# Patient Record
Sex: Female | Born: 1935 | Race: Black or African American | Hispanic: No | Marital: Single | Smoking: Former smoker
Health system: Southern US, Community
[De-identification: ages and names within clinical notes are randomized; demographics above are authoritative.]

## PROBLEM LIST (undated history)

## (undated) DIAGNOSIS — F0390 Unspecified dementia without behavioral disturbance: Secondary | ICD-10-CM

## (undated) DIAGNOSIS — M1990 Unspecified osteoarthritis, unspecified site: Secondary | ICD-10-CM

## (undated) DIAGNOSIS — R600 Localized edema: Secondary | ICD-10-CM

## (undated) DIAGNOSIS — G3184 Mild cognitive impairment, so stated: Secondary | ICD-10-CM

## (undated) DIAGNOSIS — G5603 Carpal tunnel syndrome, bilateral upper limbs: Secondary | ICD-10-CM

## (undated) DIAGNOSIS — I10 Essential (primary) hypertension: Secondary | ICD-10-CM

## (undated) DIAGNOSIS — L03012 Cellulitis of left finger: Secondary | ICD-10-CM

## (undated) DIAGNOSIS — E0590 Thyrotoxicosis, unspecified without thyrotoxic crisis or storm: Secondary | ICD-10-CM

## (undated) DIAGNOSIS — K219 Gastro-esophageal reflux disease without esophagitis: Secondary | ICD-10-CM

## (undated) DIAGNOSIS — E538 Deficiency of other specified B group vitamins: Secondary | ICD-10-CM

## (undated) DIAGNOSIS — E785 Hyperlipidemia, unspecified: Secondary | ICD-10-CM

## (undated) DIAGNOSIS — A048 Other specified bacterial intestinal infections: Secondary | ICD-10-CM

## (undated) DIAGNOSIS — R609 Edema, unspecified: Secondary | ICD-10-CM

## (undated) DIAGNOSIS — I351 Nonrheumatic aortic (valve) insufficiency: Secondary | ICD-10-CM

## (undated) HISTORY — DX: Cellulitis of left finger: L03.012

## (undated) HISTORY — PX: TOTAL KNEE ARTHROPLASTY: SHX125

## (undated) HISTORY — DX: Unspecified osteoarthritis, unspecified site: M19.90

## (undated) HISTORY — DX: Mild cognitive impairment of uncertain or unknown etiology: G31.84

## (undated) HISTORY — PX: CARPAL TUNNEL RELEASE: SHX101

## (undated) HISTORY — DX: Thyrotoxicosis, unspecified without thyrotoxic crisis or storm: E05.90

## (undated) HISTORY — DX: Deficiency of other specified B group vitamins: E53.8

## (undated) HISTORY — DX: Carpal tunnel syndrome, bilateral upper limbs: G56.03

## (undated) HISTORY — DX: Other specified bacterial intestinal infections: A04.8

---

## 2003-10-23 ENCOUNTER — Inpatient Hospital Stay: Payer: Self-pay | Admitting: Unknown Physician Specialty

## 2005-04-03 ENCOUNTER — Ambulatory Visit: Payer: Self-pay | Admitting: Internal Medicine

## 2006-08-17 ENCOUNTER — Ambulatory Visit: Payer: Self-pay | Admitting: Internal Medicine

## 2007-09-01 ENCOUNTER — Ambulatory Visit: Payer: Self-pay | Admitting: Internal Medicine

## 2009-01-24 ENCOUNTER — Ambulatory Visit: Payer: Self-pay | Admitting: Internal Medicine

## 2010-03-11 ENCOUNTER — Ambulatory Visit: Payer: Self-pay | Admitting: Internal Medicine

## 2011-05-12 ENCOUNTER — Ambulatory Visit: Payer: Self-pay | Admitting: Internal Medicine

## 2012-05-12 ENCOUNTER — Ambulatory Visit: Payer: Self-pay | Admitting: Internal Medicine

## 2012-11-07 ENCOUNTER — Emergency Department: Payer: Self-pay | Admitting: Emergency Medicine

## 2012-11-07 LAB — COMPREHENSIVE METABOLIC PANEL
Albumin: 3.6 g/dL (ref 3.4–5.0)
Alkaline Phosphatase: 133 U/L (ref 50–136)
BUN: 12 mg/dL (ref 7–18)
Chloride: 100 mmol/L (ref 98–107)
Creatinine: 1.14 mg/dL (ref 0.60–1.30)
EGFR (African American): 53 — ABNORMAL LOW
Glucose: 130 mg/dL — ABNORMAL HIGH (ref 65–99)
Osmolality: 279 (ref 275–301)
Potassium: 3.2 mmol/L — ABNORMAL LOW (ref 3.5–5.1)
SGOT(AST): 23 U/L (ref 15–37)
SGPT (ALT): 14 U/L (ref 12–78)
Sodium: 139 mmol/L (ref 136–145)

## 2012-11-07 LAB — URINALYSIS, COMPLETE
Bacteria: NONE SEEN
Bilirubin,UR: NEGATIVE
Blood: NEGATIVE
Glucose,UR: NEGATIVE mg/dL (ref 0–75)
Hyaline Cast: 9
Nitrite: NEGATIVE
Protein: NEGATIVE
Specific Gravity: 1.012 (ref 1.003–1.030)

## 2012-11-07 LAB — TROPONIN I: Troponin-I: <0.02 ng/mL

## 2012-11-07 LAB — PRO B NATRIURETIC PEPTIDE: B-Type Natriuretic Peptide: 686 pg/mL — ABNORMAL HIGH (ref 0–450)

## 2012-11-07 LAB — CBC
MCH: 30.8 pg (ref 26.0–34.0)
MCHC: 34.6 g/dL (ref 32.0–36.0)
RBC: 3.7 10*6/uL — ABNORMAL LOW (ref 3.80–5.20)

## 2013-11-23 ENCOUNTER — Ambulatory Visit: Payer: Self-pay | Admitting: Unknown Physician Specialty

## 2015-03-23 ENCOUNTER — Encounter: Payer: Self-pay | Admitting: *Deleted

## 2015-03-23 ENCOUNTER — Ambulatory Visit (INDEPENDENT_AMBULATORY_CARE_PROVIDER_SITE_OTHER)
Admission: EM | Admit: 2015-03-23 | Discharge: 2015-03-23 | Disposition: A | Discharge status: Other Acute Inpatient Hospital | Payer: Medicare Other | Source: Home / Self Care | Attending: Family Medicine | Admitting: Family Medicine

## 2015-03-23 ENCOUNTER — Ambulatory Visit (INDEPENDENT_AMBULATORY_CARE_PROVIDER_SITE_OTHER): Payer: Medicare Other

## 2015-03-23 ENCOUNTER — Encounter: Payer: Self-pay | Admitting: Emergency Medicine

## 2015-03-23 ENCOUNTER — Observation Stay
Admission: EM | Admit: 2015-03-23 | Discharge: 2015-03-26 | Disposition: A | Discharge status: Home / Self Care | Payer: Medicare Other | Attending: Internal Medicine | Admitting: Internal Medicine

## 2015-03-23 DIAGNOSIS — I351 Nonrheumatic aortic (valve) insufficiency: Secondary | ICD-10-CM | POA: Diagnosis not present

## 2015-03-23 DIAGNOSIS — E118 Type 2 diabetes mellitus with unspecified complications: Secondary | ICD-10-CM | POA: Diagnosis not present

## 2015-03-23 DIAGNOSIS — K219 Gastro-esophageal reflux disease without esophagitis: Secondary | ICD-10-CM | POA: Diagnosis present

## 2015-03-23 DIAGNOSIS — A4901 Methicillin susceptible Staphylococcus aureus infection, unspecified site: Secondary | ICD-10-CM | POA: Diagnosis not present

## 2015-03-23 DIAGNOSIS — L03114 Cellulitis of left upper limb: Secondary | ICD-10-CM | POA: Diagnosis not present

## 2015-03-23 DIAGNOSIS — L03012 Cellulitis of left finger: Principal | ICD-10-CM | POA: Insufficient documentation

## 2015-03-23 DIAGNOSIS — Z791 Long term (current) use of non-steroidal anti-inflammatories (NSAID): Secondary | ICD-10-CM | POA: Insufficient documentation

## 2015-03-23 DIAGNOSIS — IMO0001 Reserved for inherently not codable concepts without codable children: Secondary | ICD-10-CM

## 2015-03-23 DIAGNOSIS — Z823 Family history of stroke: Secondary | ICD-10-CM | POA: Insufficient documentation

## 2015-03-23 DIAGNOSIS — Z880 Allergy status to penicillin: Secondary | ICD-10-CM | POA: Insufficient documentation

## 2015-03-23 DIAGNOSIS — Z818 Family history of other mental and behavioral disorders: Secondary | ICD-10-CM | POA: Diagnosis not present

## 2015-03-23 DIAGNOSIS — E876 Hypokalemia: Secondary | ICD-10-CM | POA: Diagnosis not present

## 2015-03-23 DIAGNOSIS — I10 Essential (primary) hypertension: Secondary | ICD-10-CM | POA: Diagnosis present

## 2015-03-23 DIAGNOSIS — Z882 Allergy status to sulfonamides status: Secondary | ICD-10-CM | POA: Diagnosis not present

## 2015-03-23 DIAGNOSIS — Z87891 Personal history of nicotine dependence: Secondary | ICD-10-CM | POA: Insufficient documentation

## 2015-03-23 DIAGNOSIS — Z833 Family history of diabetes mellitus: Secondary | ICD-10-CM | POA: Diagnosis not present

## 2015-03-23 DIAGNOSIS — E785 Hyperlipidemia, unspecified: Secondary | ICD-10-CM | POA: Diagnosis not present

## 2015-03-23 DIAGNOSIS — Z886 Allergy status to analgesic agent status: Secondary | ICD-10-CM | POA: Insufficient documentation

## 2015-03-23 DIAGNOSIS — Z888 Allergy status to other drugs, medicaments and biological substances status: Secondary | ICD-10-CM | POA: Diagnosis not present

## 2015-03-23 DIAGNOSIS — L03019 Cellulitis of unspecified finger: Secondary | ICD-10-CM | POA: Diagnosis present

## 2015-03-23 DIAGNOSIS — E119 Type 2 diabetes mellitus without complications: Secondary | ICD-10-CM

## 2015-03-23 DIAGNOSIS — Z9889 Other specified postprocedural states: Secondary | ICD-10-CM | POA: Diagnosis not present

## 2015-03-23 DIAGNOSIS — L02512 Cutaneous abscess of left hand: Secondary | ICD-10-CM | POA: Diagnosis present

## 2015-03-23 HISTORY — DX: Hyperlipidemia, unspecified: E78.5

## 2015-03-23 HISTORY — DX: Edema, unspecified: R60.9

## 2015-03-23 HISTORY — DX: Localized edema: R60.0

## 2015-03-23 HISTORY — DX: Essential (primary) hypertension: I10

## 2015-03-23 HISTORY — DX: Gastro-esophageal reflux disease without esophagitis: K21.9

## 2015-03-23 HISTORY — DX: Nonrheumatic aortic (valve) insufficiency: I35.1

## 2015-03-23 LAB — CBC WITH DIFFERENTIAL/PLATELET
BASOS ABS: 0.1 10*3/uL (ref 0–0.1)
BASOS PCT: 1 %
Eosinophils Absolute: 0 10*3/uL (ref 0–0.7)
Eosinophils Relative: 0 %
HEMATOCRIT: 30.7 % — AB (ref 35.0–47.0)
HEMOGLOBIN: 10.3 g/dL — AB (ref 12.0–16.0)
Lymphocytes Relative: 6 %
Lymphs Abs: 0.7 10*3/uL — ABNORMAL LOW (ref 1.0–3.6)
MCH: 29 pg (ref 26.0–34.0)
MCHC: 33.6 g/dL (ref 32.0–36.0)
MCV: 86.4 fL (ref 80.0–100.0)
Monocytes Absolute: 0.8 10*3/uL (ref 0.2–0.9)
Monocytes Relative: 7 %
NEUTROS ABS: 9.3 10*3/uL — AB (ref 1.4–6.5)
NEUTROS PCT: 86 %
Platelets: 250 10*3/uL (ref 150–440)
RBC: 3.55 MIL/uL — AB (ref 3.80–5.20)
RDW: 14 % (ref 11.5–14.5)
WBC: 10.8 10*3/uL (ref 3.6–11.0)

## 2015-03-23 LAB — BASIC METABOLIC PANEL
ANION GAP: 9 (ref 5–15)
BUN: 13 mg/dL (ref 6–20)
CHLORIDE: 97 mmol/L — AB (ref 101–111)
CO2: 28 mmol/L (ref 22–32)
Calcium: 8.6 mg/dL — ABNORMAL LOW (ref 8.9–10.3)
Creatinine, Ser: 0.98 mg/dL (ref 0.44–1.00)
GFR calc non Af Amer: 53 mL/min — ABNORMAL LOW (ref >60)
Glucose, Bld: 183 mg/dL — ABNORMAL HIGH (ref 65–99)
Potassium: 3.1 mmol/L — ABNORMAL LOW (ref 3.5–5.1)
Sodium: 134 mmol/L — ABNORMAL LOW (ref 135–145)

## 2015-03-23 NOTE — ED Notes (Signed)
Pt presents to ED with swollen left index finger. Area is swollen and red up to and including knuckles of left hand. Pt is a/o with NAD at this time in triage.

## 2015-03-23 NOTE — ED Notes (Signed)
Left index finger edematous, and discolored. Denies injury. Stated onset 1 week ago but much worse today.

## 2015-03-23 NOTE — ED Provider Notes (Signed)
[facility]  ____________________________________________  Time seen: Approximately 1800 PM  I have reviewed the triage vital signs and the nursing notes.   HISTORY  Chief Complaint Hand Pain   HPI Christine [name] is a 82 y.o. female presents with family at bedside for the complaints of left index finger that is red and swollen and painful. Patient reports that the area has gradually progressed and over the last 1 week. States the last 2 days it has gotten much larger and redder. Patient states that she is even noticed the last 2 days that her hand is beginning to become red and swollen. Denies fall or injury. Denies known break in skin. Denies drainage. Denies fevers. Denies other complaints. Denies loss of sensation.  Reports has remained active. Reports continues to eat and drink well. Reports she is right-hand dominant. Denies history of diabetes.  PCP: Gilford Rile   Past Medical History  Diagnosis Date  . Hypertension     There are no active problems to display for this patient.   Past Surgical History  Procedure Laterality Date  . Carpal tunnel release Bilateral     No current outpatient prescriptions on file.  Allergies Review of patient's allergies indicates no known allergies.  History reviewed. No pertinent family history.  Social History Social History  Substance Use Topics  . Smoking status: Former Research scientist (life sciences)  . Smokeless tobacco: None  . Alcohol Use: No    Review of Systems Constitutional: No fever/chills Eyes: No visual changes. ENT: No sore throat. Cardiovascular: Denies chest pain. Respiratory: Denies shortness of breath. Gastrointestinal: No abdominal pain.  No nausea, no vomiting.  No diarrhea.  No constipation. Genitourinary: Negative for dysuria. Musculoskeletal: Negative for back pain. Skin: Negative for rash. left second finger redness and swelling.  Neurological: Negative for headaches, focal weakness or numbness.  10-point ROS  otherwise negative.  ____________________________________________   PHYSICAL EXAM:  VITAL SIGNS: ED Triage Vitals  Enc Vitals Group     BP 03/23/15 1725 131/64 mmHg     Pulse Rate 03/23/15 1725 68     Resp 03/23/15 1725 16     Temp 03/23/15 1725 98.3 F (36.8 C)     Temp Source 03/23/15 1725 Oral     SpO2 03/23/15 1725 98 %     Weight 03/23/15 1725 220 lb (99.791 kg)     Height 03/23/15 1725 5\' 6"  (1.676 m)     Head Cir --      Peak Flow --      Pain Score 03/23/15 1908 4     Pain Loc --      Pain Edu? --      Excl. in [place]? --     Constitutional: Alert and oriented. Well appearing and in no acute distress. Eyes: Conjunctivae are normal. PERRL. EOMI. Head: Atraumatic.  Nose: No congestion/rhinnorhea.  Mouth/Throat: Mucous membranes are moist.  Neck: No stridor.  No cervical spine tenderness to palpation. Cardiovascular: Normal rate, regular rhythm. Grossly normal heart sounds.  Good peripheral circulation. Respiratory: Normal respiratory effort.  No retractions. Lungs CTAB. Gastrointestinal: Soft and nontender.  Musculoskeletal: No lower or upper extremity tenderness. bilateral lower extremities mild edema, per patient chronic and unchanged. No cervical, thoracic or lumbar tenderness to palpation.   Neurologic:  Normal speech and language. No gross focal neurologic deficits are appreciated. No gait instability. Skin:  Skin is warm, dry and intact. No rash noted. Except : Left second digit moderate swelling with moderate ecchymosis, distal left second finger  at the base of the fingernail extending to PIP joint area of fluctuance and visible purulent material, no active drainage, left second finger erythema and swelling further extends to left hand along the first and second metacarpal, left second finger moderate tenderness to palpation with decreased flexion and extension, sensation intact to left second finger as well as capillary refill less than 2 seconds. No noted break in  skin.  Psychiatric: Mood and affect are normal. Speech and behavior are normal.  ____________________________________________   LABS (all labs ordered are listed, but only abnormal results are displayed)  Labs Reviewed - No data to display  RADIOLOGY  EXAM: LEFT INDEX FINGER 2+V  COMPARISON: None.  FINDINGS: No fracture or dislocation is seen.  The joint spaces are preserved. No marginal erosions.  Mild diffuse soft tissue swelling, most prominent dorsally.  No radiopaque foreign body is seen.  IMPRESSION: Mild diffuse soft tissue swelling.  No fracture, dislocation, or radiopaque foreign body is seen.   Electronically Signed By: Julian Hy M.D. On: 03/23/2015 18:24  I, Marylene Land, personally viewed and evaluated these images (plain radiographs) as part of my medical decision making, as well as reviewing the written report by the radiologist.  ____________________________________________  INITIAL IMPRESSION / [place] / ED COURSE  Pertinent labs & imaging results that were available during my care of the patient were reviewed by me and considered in my medical decision making (see chart for details).  very well-appearing patient. No acute distress. Presents for the complaints of gradual onset of left second finger and left hand redness and swelling over the last 1 week. Denies fall or injury. Denies known break in skin. Reports the last 2 days has acutely increased. Denies drainage. Patient with large amount of erythema and swelling with visible purulence beneath the skin. X-ray with mild diffuse soft tissue swelling, no fracture dislocation or radiopaque foreign body per radiologist.   Suspect left second finger paronychia which worsened in to progressing cellulitis.   Discussed in detail with patient and family members concerned for need for IV antibiotics. Discussed the concern that patient would likely not improve with just oral  antibiotics at this point. Counseled with Dr. Alveta Heimlich who also saw patient agreed with plan. Recommend for patient to go to emergency room of her choice for further evaluation and likely IV antibiotics. Discussed with patient to go to emergency room that would likely have hand surgeon present and may need OR I&D. However patient and family reports that they will go to [facility] ER. [facility] called and given report. Patient and family state that they will go by private vehicle and do not want to go by EMS. patient stable at the time of transfer.  Discussed follow up with Primary care physician this week. Discussed follow up and return parameters including no resolution or any worsening concerns. Patient verbalized understanding and agreed to plan.   ____________________________________________   FINAL CLINICAL IMPRESSION(S) / ED DIAGNOSES  Final diagnoses:  Cellulitis of second finger of left hand  Cellulitis of left hand      Note: This dictation was prepared with Dragon dictation along with smaller phrase technology. Any transcriptional errors that result from this process are unintentional.    Marylene Land, NP 03/23/15 2002  Marylene Land, NP 03/23/15 2004

## 2015-03-23 NOTE — ED Provider Notes (Signed)
[facility] Emergency Department Provider Note  Time seen: 11:55 PM  I have reviewed the triage vital signs and the nursing notes.   HISTORY  Chief Complaint Hand Problem    HPI Christine [name] is a 82 y.o. female with a past medical history of hypertension who presents the emergency department with a swollen tender left index finger. According to the patient proximal by one week ago she began with mild pain and swelling around the tip of her left index finger, she states of the past 2 days it is really increased and is now extending into the hand. Patient denies fever, does state nausea but denies vomiting.Describes the pain as moderate, much worse with any attempted moving, patient cannot bend the finger.     Past Medical History  Diagnosis Date  . Hypertension     There are no active problems to display for this patient.   Past Surgical History  Procedure Laterality Date  . Carpal tunnel release Bilateral   . Total knee arthroplasty Bilateral     No current outpatient prescriptions on file.  Allergies Review of patient's allergies indicates no known allergies.  No family history on file.  Social History Social History  Substance Use Topics  . Smoking status: Former Research scientist (life sciences)  . Smokeless tobacco: None  . Alcohol Use: No    Review of Systems Constitutional: Negative for fever. Cardiovascular: Negative for chest pain. Respiratory: Negative for shortness of breath. Gastrointestinal: Negative for abdominal pain. Positive nausea. Skin: Redness, swelling of the left index finger Neurological: Negative for headache 10-point ROS otherwise negative.  ____________________________________________   PHYSICAL EXAM:  VITAL SIGNS: ED Triage Vitals  Enc Vitals Group     BP 03/23/15 2035 127/72 mmHg     Pulse Rate 03/23/15 2035 69     Resp 03/23/15 2035 20     Temp 03/23/15 2035 98 F (36.7 C)     Temp Source 03/23/15 2035 Oral   SpO2 03/23/15 2035 98 %     Weight 03/23/15 2035 232 lb (105.235 kg)     Height 03/23/15 2035 5\' 5"  (1.651 m)     Head Cir --      Peak Flow --      Pain Score 03/23/15 2121 0     Pain Loc --      Pain Edu? --      Excl. in [place]? --     Constitutional: Alert and oriented. Well appearing and in no distress. Eyes: Normal exam ENT   Head: Normocephalic and atraumatic.   Mouth/Throat: Mucous membranes are moist. Cardiovascular: Normal rate, regular rhythm. No murmur Respiratory: Normal respiratory effort without tachypnea nor retractions. Breath sounds are clear  Gastrointestinal: Soft and nontender. No distention.  Musculoskeletal: Redness, swelling of the left index finger. Patient is unable to bend the finger. Neurologic:  Normal speech and language. No gross focal neurologic deficits Skin:  Skin is warm, dry and intact. Redness as stated above. Psychiatric: Mood and affect are normal. Speech and behavior are normal.   ____________________________________________     RADIOLOGY  X-ray performed at urgent care shows no abnormality  ____________________________________________   INITIAL IMPRESSION / ASSESSMENT AND PLAN / ED COURSE  Pertinent labs & imaging results that were available during my care of the patient were reviewed by me and considered in my medical decision making (see chart for details).  Labs are largely within normal limits besides a slightly elevated blood glucose of 183, patient denies any history  of diabetes. Hand concerning for significant left index finger infection. We will place an IV, start antibiotics. I will discussed with orthopedics, patient will likely require operation to open the finger.   Orthopedic physician is coming to the hospital to perform a bedside procedure to drain the finger. Request 2 g of Ancef IV, also states the patient will require admission to the medical service for continued antibiotics with orthopedics consultation. We will  discuss this with the hospitalist team.   Orthopedics at bedside performing incision and drainage. Hospitalist to admit. ____________________________________________   FINAL CLINICAL IMPRESSION(S) / ED DIAGNOSES  Deep hand infection, left Cellulitis   Harvest Dark, MD 03/24/15 [phone]

## 2015-03-23 NOTE — Discharge Instructions (Signed)
Go directly to emergency room as discussed. This is very important.   Cellulitis Cellulitis is an infection of the skin and the tissue beneath it. The infected area is usually red and tender. Cellulitis occurs most often in the arms and lower legs.  CAUSES  Cellulitis is caused by bacteria that enter the skin through cracks or cuts in the skin. The most common types of bacteria that cause cellulitis are staphylococci and streptococci. SIGNS AND SYMPTOMS   Redness and warmth.  Swelling.  Tenderness or pain.  Fever. DIAGNOSIS  Your health care provider can usually determine what is wrong based on a physical exam. Blood tests may also be done. TREATMENT  Treatment usually involves taking an antibiotic medicine. HOME CARE INSTRUCTIONS   Take your antibiotic medicine as directed by your health care provider. Finish the antibiotic even if you start to feel better.  Keep the infected arm or leg elevated to reduce swelling.  Apply a warm cloth to the affected area up to 4 times per day to relieve pain.  Take medicines only as directed by your health care provider.  Keep all follow-up visits as directed by your health care provider. SEEK MEDICAL CARE IF:   You notice red streaks coming from the infected area.  Your red area gets larger or turns dark in color.  Your bone or joint underneath the infected area becomes painful after the skin has healed.  Your infection returns in the same area or another area.  You notice a swollen bump in the infected area.  You develop new symptoms.  You have a fever. SEEK IMMEDIATE MEDICAL CARE IF:   You feel very sleepy.  You develop vomiting or diarrhea.  You have a general ill feeling (malaise) with muscle aches and pains.   This information is not intended to replace advice given to you by your health care provider. Make sure you discuss any questions you have with your health care provider.   Document Released: 10/09/2004 Document  Revised: 09/20/2014 Document Reviewed: 03/17/2011 Elsevier Interactive Patient Education 2016 [place] is an infection of the skin. It happens near a fingernail or toenail. It may cause pain and swelling around the nail. Usually, it is not serious and it clears up with treatment. HOME CARE  Soak the fingers or toes in warm water as told by your doctor. You may be told to do this for 20 minutes, 2-3 times a day.  Keep the area dry when you are not soaking it.  Take medicines only as told by your doctor.  If you were given an antibiotic medicine, finish all of it even if you start to feel better.  Keep the affected area clean.  Do not try to drain a fluid-filled bump yourself.  Wear rubber gloves when putting your hands in water.  Wear gloves if your hands might touch cleaners or chemicals.  Follow your doctor's instructions about:  Wound care.  Bandage (dressing) changes and removal. GET HELP IF:  Your symptoms get worse or do not improve.  You have a fever or chills.  You have redness spreading from the affected area.  You have more fluid, blood, or pus coming from the affected area.  Your finger or knuckle is swollen or is hard to move.   This information is not intended to replace advice given to you by your health care provider. Make sure you discuss any questions you have with your health care provider.   Document  Released: 12/18/2008 Document Revised: 05/16/2014 Document Reviewed: 12/07/2013 Elsevier Interactive Patient Education [employer].

## 2015-03-24 ENCOUNTER — Encounter: Payer: Self-pay | Admitting: Internal Medicine

## 2015-03-24 DIAGNOSIS — E119 Type 2 diabetes mellitus without complications: Secondary | ICD-10-CM

## 2015-03-24 DIAGNOSIS — I10 Essential (primary) hypertension: Secondary | ICD-10-CM | POA: Diagnosis present

## 2015-03-24 DIAGNOSIS — L03019 Cellulitis of unspecified finger: Secondary | ICD-10-CM | POA: Diagnosis present

## 2015-03-24 DIAGNOSIS — K219 Gastro-esophageal reflux disease without esophagitis: Secondary | ICD-10-CM | POA: Diagnosis present

## 2015-03-24 DIAGNOSIS — E785 Hyperlipidemia, unspecified: Secondary | ICD-10-CM | POA: Diagnosis present

## 2015-03-24 LAB — GLUCOSE, CAPILLARY
GLUCOSE-CAPILLARY: 106 mg/dL — AB (ref 65–99)
GLUCOSE-CAPILLARY: 100 mg/dL — AB (ref 65–99)
Glucose-Capillary: 140 mg/dL — ABNORMAL HIGH (ref 65–99)

## 2015-03-24 LAB — BASIC METABOLIC PANEL
ANION GAP: 8 (ref 5–15)
BUN: 13 mg/dL (ref 6–20)
CO2: 26 mmol/L (ref 22–32)
Calcium: 8.3 mg/dL — ABNORMAL LOW (ref 8.9–10.3)
Chloride: 102 mmol/L (ref 101–111)
Creatinine, Ser: 0.9 mg/dL (ref 0.44–1.00)
GFR, EST NON AFRICAN AMERICAN: 59 mL/min — AB (ref >60)
GLUCOSE: 100 mg/dL — AB (ref 65–99)
POTASSIUM: 3.1 mmol/L — AB (ref 3.5–5.1)
Sodium: 136 mmol/L (ref 135–145)

## 2015-03-24 LAB — CBC
HEMATOCRIT: 29 % — AB (ref 35.0–47.0)
HEMOGLOBIN: 9.9 g/dL — AB (ref 12.0–16.0)
MCH: 29.3 pg (ref 26.0–34.0)
MCHC: 34.1 g/dL (ref 32.0–36.0)
MCV: 86 fL (ref 80.0–100.0)
Platelets: 231 10*3/uL (ref 150–440)
RBC: 3.37 MIL/uL — ABNORMAL LOW (ref 3.80–5.20)
RDW: 14 % (ref 11.5–14.5)
WBC: 7.7 10*3/uL (ref 3.6–11.0)

## 2015-03-24 LAB — HEMOGLOBIN A1C: Hgb A1c MFr Bld: 5.4 % (ref 4.0–6.0)

## 2015-03-24 MED ORDER — ONDANSETRON HCL 4 MG PO TABS: 4.0000 mg | ORAL_TABLET | Freq: Four times a day (QID) | ORAL | Status: DC | PRN

## 2015-03-24 MED ORDER — POTASSIUM CHLORIDE CRYS ER 20 MEQ PO TBCR
40.0000 meq | EXTENDED_RELEASE_TABLET | Freq: Once | ORAL | Status: AC
  Administered 2015-03-24: 40 meq via ORAL
  Filled 2015-03-24: qty 2

## 2015-03-24 MED ORDER — ONDANSETRON HCL 4 MG/2ML IJ SOLN
4.0000 mg | Freq: Four times a day (QID) | INTRAMUSCULAR | Status: DC | PRN
Start: 2015-03-24 — End: 2015-03-26

## 2015-03-24 MED ORDER — PANTOPRAZOLE SODIUM 40 MG PO TBEC
40.0000 mg | DELAYED_RELEASE_TABLET | Freq: Every day | ORAL | Status: DC
  Administered 2015-03-24 – 2015-03-26 (×3): 40 mg via ORAL
  Filled 2015-03-24 (×3): qty 1

## 2015-03-24 MED ORDER — VANCOMYCIN HCL 10 G IV SOLR
1250.0000 mg | INTRAVENOUS | Status: DC
  Administered 2015-03-24: 1250 mg via INTRAVENOUS
  Filled 2015-03-24 (×2): qty 1250

## 2015-03-24 MED ORDER — VANCOMYCIN HCL 10 G IV SOLR
1250.0000 mg | Freq: Once | INTRAVENOUS | Status: AC
  Administered 2015-03-24: 1250 mg via INTRAVENOUS
  Filled 2015-03-24 (×3): qty 1250

## 2015-03-24 MED ORDER — ACETAMINOPHEN 325 MG PO TABS: 650.0000 mg | ORAL_TABLET | Freq: Four times a day (QID) | ORAL | Status: DC | PRN

## 2015-03-24 MED ORDER — DEXTROSE 5 % IV SOLN
2.0000 g | Freq: Two times a day (BID) | INTRAVENOUS | Status: DC
  Administered 2015-03-24 (×2): 2 g via INTRAVENOUS
  Filled 2015-03-24 (×4): qty 2

## 2015-03-24 MED ORDER — TORSEMIDE 20 MG PO TABS
20.0000 mg | ORAL_TABLET | Freq: Two times a day (BID) | ORAL | Status: DC
  Administered 2015-03-24 – 2015-03-26 (×5): 20 mg via ORAL
  Filled 2015-03-24: qty 2
  Filled 2015-03-24: qty 1
  Filled 2015-03-24: qty 2
  Filled 2015-03-24: qty 1
  Filled 2015-03-24: qty 2
  Filled 2015-03-24 (×2): qty 1

## 2015-03-24 MED ORDER — ACETAMINOPHEN 650 MG RE SUPP: 650.0000 mg | Freq: Four times a day (QID) | RECTAL | Status: DC | PRN

## 2015-03-24 MED ORDER — ENOXAPARIN SODIUM 40 MG/0.4ML ~~LOC~~ SOLN
40.0000 mg | SUBCUTANEOUS | Status: DC
  Administered 2015-03-25: 40 mg via SUBCUTANEOUS
  Filled 2015-03-24 (×2): qty 0.4

## 2015-03-24 MED ORDER — CEFAZOLIN SODIUM-DEXTROSE 2-3 GM-% IV SOLR
2.0000 g | Freq: Once | INTRAVENOUS | Status: AC
  Administered 2015-03-24: 2 g via INTRAVENOUS
  Filled 2015-03-24: qty 50

## 2015-03-24 MED ORDER — IRBESARTAN 150 MG PO TABS
300.0000 mg | ORAL_TABLET | Freq: Every day | ORAL | Status: DC
  Administered 2015-03-24 – 2015-03-26 (×3): 300 mg via ORAL
  Filled 2015-03-24 (×3): qty 2

## 2015-03-24 MED ORDER — INSULIN ASPART 100 UNIT/ML ~~LOC~~ SOLN: 0.0000 [IU] | Freq: Every day | SUBCUTANEOUS | Status: DC

## 2015-03-24 MED ORDER — MORPHINE SULFATE (PF) 2 MG/ML IV SOLN
2.0000 mg | INTRAVENOUS | Status: DC | PRN
  Administered 2015-03-24: 2 mg via INTRAVENOUS
  Filled 2015-03-24: qty 1

## 2015-03-24 MED ORDER — INSULIN ASPART 100 UNIT/ML ~~LOC~~ SOLN
0.0000 [IU] | Freq: Three times a day (TID) | SUBCUTANEOUS | Status: DC
  Administered 2015-03-24: 1 [IU] via SUBCUTANEOUS
  Filled 2015-03-24: qty 1

## 2015-03-24 MED ORDER — LIDOCAINE HCL (PF) 1 % IJ SOLN
INTRAMUSCULAR | Status: AC
  Administered 2015-03-24: 01:00:00
  Filled 2015-03-24: qty 10

## 2015-03-24 MED ORDER — MORPHINE SULFATE (PF) 4 MG/ML IV SOLN: 4.0000 mg | Freq: Once | INTRAVENOUS | Status: DC

## 2015-03-24 NOTE — Procedures (Signed)
  11043 Debridement; skin, subcutaneous tissue and muscle  Procedure: The patient was identified in the emergency department room. A preprocedure timeout was performed. 1% lidocaine was injected and a regional blockade of the index finger of the left hand. 10 mL were used in total 4 mL on the radial, 4 mL on the ulnar and 2 mL on the dorsal surface of the finger.  After successful block was performed, an incision was made through the cutaneous layer revealing obvious purulent material. Cultures were then sent at this time both tissue as well as fluid. The surrounding cutaneous layer was removed as it was nonviable. The infection tracked over the DIP as well as to the fingernail. For this reason the fingernail was removed. Further purulence was expressed from the wound. Blunt dissection was performed down through the subcutaneous layers through the extensor tendon on the dorsal surface of the finger. Stents or tendon did reveal some fraying. There was no obvious violation of the DIP joint however. In addition the infection tracked in a subcutaneous manner along the extensor tendon sheath proximally to the PIP joint. This was bluntly dissected and further purulence was expressed. Second modified anterior lateral incision was made over the proximal portion of the finger to serve as a conduit for which to irrigate the wound. The wound was then copiously irrigated with 1 L of normal saline. The finger was repeatedly cleaned and debrided.  A small amount of packing was placed in the wound. The finger was then dressed in a sterile manner. A compression dressing was also applied.  The patient will be admitted to the hospitalist service for IV antibiotics and management of a chronic infection.  The wound will be continuously monitored to ensure that there is no progression of symptoms.  Juliene Pina, MD

## 2015-03-24 NOTE — Progress Notes (Addendum)
Serum k 3.1 dr hower notified. md ordered kdur 2meq once. Home meds bverified with [facility]

## 2015-03-24 NOTE — Consult Note (Signed)
ORTHOPAEDIC CONSULTATION  PATIENT NAME: Christine [name] DOB: 05-10-1932  MRN: [id number]  REQUESTING PHYSICIAN: Lance Coon, MD  Chief Complaint: Finger swelling/left second digit  HPI: Christine [name] is a 82 y.o. female who complains of  finger swelling to the left index finger. She initially noticed a small opening on the dorsum of her finger just proximal to her fingernail several days ago. She is uncertain whether or not she got a small cut or if she had a bug bite. However over the course of the last several days she's noted increased swelling and redness in her finger. Her entire fingers now swollen at this time. She is here today to discuss need for surgical management as well as antibiotics. She's had no purulent drainage however there is obvious subcutaneous purulence. She's had no tracking pain down the volar surface of her hand. She does note swelling in her finger and difficulty flexing.  She has not had a finger infection before. She is not a smoker, and she does not have diagnosed diabetes. She has not had MRSA infection previously.  Past Medical History  Diagnosis Date  . Hypertension   . HLD (hyperlipidemia)   . GERD (gastroesophageal reflux disease)   . Aortic insufficiency    Past Surgical History  Procedure Laterality Date  . Carpal tunnel release Bilateral   . Total knee arthroplasty Bilateral    Social History   Social History  . Marital Status: Single    Spouse Name: N/A  . Number of Children: N/A  . Years of Education: N/A   Social History Main Topics  . Smoking status: Former Research scientist (life sciences)  . Smokeless tobacco: None  . Alcohol Use: No  . Drug Use: No  . Sexual Activity: Not Asked   Other Topics Concern  . None   Social History Narrative   Family History  Problem Relation Age of Onset  . Stroke Mother   . Alzheimer's disease Father   . Diabetes Sister    Allergies  Allergen Reactions  . Sulfa Antibiotics Other (See Comments)    Reaction:  unknown Patient states no allergies, but family states allergy  . Penicillins Rash and Other (See Comments)    Family states rash, patient states no allergies   Prior to Admission medications   Medication Sig Start Date End Date Taking? Authorizing Provider  mometasone (ELOCON) 0.1 % lotion Apply 4 drops topically at bedtime as needed. Drops placed in each ear 03/13/15  Yes Historical Provider, MD  olmesartan (BENICAR) 40 MG tablet Take 40 mg by mouth daily.   Yes Historical Provider, MD  torsemide (DEMADEX) 20 MG tablet Take 20 mg by mouth 2 (two) times daily.   Yes Historical Provider, MD   Dg Finger Index Left  03/23/2015  CLINICAL DATA:  Redness/swelling of 1st digit EXAM: LEFT INDEX FINGER 2+V COMPARISON:  None. FINDINGS: No fracture or dislocation is seen. The joint spaces are preserved.  No marginal erosions. Mild diffuse soft tissue swelling, most prominent dorsally. No radiopaque foreign body is seen. IMPRESSION: Mild diffuse soft tissue swelling. No fracture, dislocation, or radiopaque foreign body is seen. Electronically Signed   By: Julian Hy M.D.   On: 03/23/2015 18:24    Positive ROS: All other systems have been reviewed and were otherwise negative with the exception of those mentioned in the HPI and as above.  Physical Exam: General: Alert and alert in no acute distress. HEENT: Atraumatic and normocephalic. Sclera are clear. Extraocular motion is intact. Oropharynx is  clear with moist mucosa. Neck: Supple, nontender, good range of motion. No JVD or carotid bruits. Lungs: Clear to auscultation bilaterally. Cardiovascular: Regular rate and rhythm with normal S1 and S2. No murmurs. No gallops or rubs. Pedal pulses are palpable bilaterally. Homans test is negative bilaterally. No significant pretibial or ankle edema. Abdomen: Soft, nontender, and nondistended. Bowel sounds are present. Skin: No lesions in the area of chief complaint Neurologic: Awake, alert, and oriented.  Sensory function is grossly intact. Motor strength is felt to be 5 over 5 bilaterally. No clonus or tremor. Good motor coordination. Lymphatic: No axillary or cervical lymphadenopathy  MUSCULOSKELETAL: Left hand  Skin intact with no open wound, however there is significant purulence under the dermal layer. Obvious area of excessive purulence just proximal to the nailbed that is about 2 mm in diameter Subcutaneous liftoff of tissue noted just proximal to the DIP. No tenderness to palpation along the volar surface of the finger, with the exception of the fingertip. No tenderness down the palmar surface of the hand with palpation. The finger is well-perfused with less than 2 seconds of capillary refill Sensation intact distally.  Assessment: Left index finger Dorsal phelon with subsequent dorsal tracking infection and cellulitis.  Plan: 1. Irrigation and debridement performed in the emergency department under local anesthesia - please see procedure note 2. Patient admitted to the hospitalist service 3. Recommend vancomycin and Zosyn for initial broad-spectrum antibiotics 4. Recommend compression elevation and ice every 2 hours for 30 minutes 5. If the patient's kidney function is stable, recommend Toradol for inflammation control. 6. Wound soaks to begin on day 1, 03/25/15 7. I will continue to monitor the wound. 8. FU cultures for focused treatment  I had a long discussion with the patient and the family with regards to her finger. This is an infection that has gotten out of control over the course of several days. For this reason the tissues of the finger has become compromised. Because of this I informed her that there is a risk of the following: Progression to flexor tenosynovitis, worsening tracking infection, need for amputation of the finger, loss of her fingernail, and potential long-term stiffness of the finger.  I will continue to monitor the wound of the next several days as she  receives IV antibiotics. If there are any concerns of flexor tenosynovitis or worsening infection, we will likely proceed to the operating room.  Juliene Pina, MD

## 2015-03-24 NOTE — Progress Notes (Signed)
[name] NAME: Christine [name]    MR#:  [id number]  DATE OF BIRTH:  [birth year]  SUBJECTIVE:  Patient admitted yesterday with left hand pain Had I&D performed in emergency department States pain has improved swelling remains, denies any fevers or chills  REVIEW OF SYSTEMS:  CONSTITUTIONAL: No fever, fatigue or weakness.  EYES: No blurred or double vision.  EARS, NOSE, AND THROAT: No tinnitus or ear pain.  RESPIRATORY: No cough, shortness of breath, wheezing or hemoptysis.  CARDIOVASCULAR: No chest pain, orthopnea, edema.  GASTROINTESTINAL: No nausea, vomiting, diarrhea or abdominal pain.  GENITOURINARY: No dysuria, hematuria.  ENDOCRINE: No polyuria, nocturia,  HEMATOLOGY: No anemia, easy bruising or bleeding SKIN: No rash or lesion. MUSCULOSKELETAL: No joint pain or arthritis.  Positive left finger pain NEUROLOGIC: No tingling, numbness, weakness.  PSYCHIATRY: No anxiety or depression.   DRUG ALLERGIES:   Allergies  Allergen Reactions  . Sulfa Antibiotics Other (See Comments)    Reaction: unknown Patient states no allergies, but family states allergy  . Penicillins Rash and Other (See Comments)    Family states rash, patient states no allergies    VITALS:  Blood pressure 142/64, pulse 80, temperature 99.7 F (37.6 C), temperature source Oral, resp. rate 18, height 5\' 5"  (1.651 m), weight 105.235 kg (232 lb), SpO2 97 %.  PHYSICAL EXAMINATION:  VITAL SIGNS: Filed Vitals:   03/24/15 0326 03/24/15 0717  BP: 148/57 142/64  Pulse: 77 80  Temp: 98.9 F (37.2 C) 99.7 F (37.6 C)  Resp: 16 18   GENERAL:82 y.o.female currently in no acute distress.  HEAD: Normocephalic, atraumatic.  EYES: Pupils equal, round, reactive to light. Extraocular muscles intact. No scleral icterus.  MOUTH: Moist mucosal membrane. Dentition intact. No abscess noted.  EAR, NOSE, THROAT: Clear without exudates. No external lesions.   NECK: Supple. No thyromegaly. No nodules. No JVD.  PULMONARY: Clear to ascultation, without wheeze rails or rhonci. No use of accessory muscles, Good respiratory effort. good air entry bilaterally CHEST: Nontender to palpation.  CARDIOVASCULAR: S1 and S2. Regular rate and rhythm. No murmurs, rubs, or gallops. No edema. Pedal pulses 2+ bilaterally.  GASTROINTESTINAL: Soft, nontender, nondistended. No masses. Positive bowel sounds. No hepatosplenomegaly.  MUSCULOSKELETAL: No swelling, clubbing, or edema. Range of motion full in all extremities. Left hand wrapped in gauze dressing clean dry and intact NEUROLOGIC: Cranial nerves II through XII are intact. No gross focal neurological deficits. Sensation intact. Reflexes intact.  SKIN: No ulceration, lesions, rashes, or cyanosis. Skin warm and dry. Turgor intact.  PSYCHIATRIC: Mood, affect within normal limits. The patient is awake, alert and oriented x 3. Insight, judgment intact.      LABORATORY PANEL:   CBC  Recent Labs Lab 03/24/15 0344  WBC 7.7  HGB 9.9*  HCT 29.0*  PLT 231   ------------------------------------------------------------------------------------------------------------------  Chemistries   Recent Labs Lab 03/24/15 0344  NA 136  K 3.1*  CL 102  CO2 26  GLUCOSE 100*  BUN 13  CREATININE 0.90  CALCIUM 8.3*   ------------------------------------------------------------------------------------------------------------------  Cardiac Enzymes No results for input(s): TROPONINI in the last 168 hours. ------------------------------------------------------------------------------------------------------------------  RADIOLOGY:  Dg Finger Index Left  03/23/2015  CLINICAL DATA:  Redness/swelling of 1st digit EXAM: LEFT INDEX FINGER 2+V COMPARISON:  None. FINDINGS: No fracture or dislocation is seen. The joint spaces are preserved.  No marginal erosions. Mild diffuse soft tissue swelling, most prominent dorsally. No  radiopaque foreign body is seen. IMPRESSION: Mild diffuse  soft tissue swelling. No fracture, dislocation, or radiopaque foreign body is seen. Electronically Signed   By: Julian Hy M.D.   On: 03/23/2015 18:24    EKG:   Orders placed or performed in visit on 11/07/12  . EKG 12-Lead    ASSESSMENT AND PLAN:   82 year old African-American female admitted 03/23/2015 with left hand cellulitis  1. Left hand cellulitis: The following input appreciated remains on current antibiotics follow culture data 2. GERD without esophagitis PPI therapy 3. Hypokalemia: Replace 4. Venous thrombi embolism prophylactic Lovenox     All the records are reviewed and case discussed with Care Management/Social Workerr. Management plans discussed with the patient, family and they are in agreement.  CODE STATUS: Full  TOTAL TIME TAKING CARE OF THIS PATIENT: 28 minutes.   POSSIBLE D/C IN 2-3 DAYS, DEPENDING ON CLINICAL CONDITION.   Emory Gallentine,  Karenann Cai.D on 03/24/2015 at 12:18 PM  Between 7am to 6pm - Pager - [phone]  After 6pm: House Pager: - [phone]  [facility]  Office  [phone]  CC: Primary care physician; No primary care provider on file.

## 2015-03-24 NOTE — Evaluation (Signed)
Physical Therapy Evaluation Patient Details Name: Christine [name] MRN: [id number] DOB: Oct 12, 1932 Today's Date: 03/24/2015   History of Present Illness  Pt here with L index finger cellulitis, she had an I&D and is in under observation status  Clinical Impression  Pt reports that she is not hurting very much in her finger, but generally feels tired and weak.  She is able to walk ~80 ft, but reports being much slower and less confident than normal.  Pt would benefit from using a cane next PT session.  Overall pt does well but is not at her baseline.  Pt's daughter is currently in the hospital, she would need someone to assist with ADLs if daughter is not yet home on d/c.     Follow Up Recommendations Home health PT    Equipment Recommendations       Recommendations for Other Services       Precautions / Restrictions Restrictions Weight Bearing Restrictions: No      Mobility  Bed Mobility Overal bed mobility: Modified Independent             General bed mobility comments: Pt needs UE use of rails but does not need direct assist  Transfers Overall transfer level: Modified independent Equipment used: 1 person hand held assist             General transfer comment: Pt able to rise to standing with light use of UEs on bed and PT's hand  Ambulation/Gait Ambulation/Gait assistance: Min guard Ambulation Distance (Feet): 80 Feet Assistive device: 1 person hand held assist       General Gait Details: Pt was able to ambulate with slow, stooped gait, but has no LOBs and is not excessively fatigued.  She is able to take a few steps here and there w/o UE, but would benefit from trying [facility] next PT session   Stairs            Wheelchair Mobility    Modified Rankin (Stroke Patients Only)       Balance                                             Pertinent Vitals/Pain Pain Assessment: No/denies pain (only when she tries to bend finger)     Home Living Family/patient expects to be discharged to:: Private residence Living Arrangements: Children Available Help at Discharge:  (daughter works night shift, is around during the day)   Home Access: Stairs to enter Entrance Stairs-Rails: Can reach both Technical brewer of Steps: 3   Home Equipment: Cane - single point (does not use)      Prior Function Level of Independence: Independent         Comments: Pt reports that she is rarely out of the home, but that she is able to get around w/o issue and states she has not had falls in the last 6 months.     Hand Dominance        Extremity/Trunk Assessment   Upper Extremity Assessment: Overall WFL for tasks assessed (L hand/grip not tested)           Lower Extremity Assessment: Overall WFL for tasks assessed         Communication   Communication: No difficulties  Cognition Arousal/Alertness: Awake/alert Behavior During Therapy: WFL for tasks assessed/performed Overall Cognitive Status: Within Functional Limits for tasks assessed  General Comments      Exercises        Assessment/Plan    PT Assessment Patient needs continued PT services  PT Diagnosis Difficulty walking;Generalized weakness   PT Problem List Decreased strength;Decreased activity tolerance;Decreased balance;Decreased safety awareness;Decreased knowledge of use of DME;Decreased mobility;Decreased coordination  PT Treatment Interventions Gait training;DME instruction;Functional mobility training;Therapeutic activities;Therapeutic exercise;Balance training   PT Goals (Current goals can be found in the Care Plan section) Acute Rehab PT Goals Patient Stated Goal: "Go home soon" PT Goal Formulation: With patient Time For Goal Achievement: 04/07/15 Potential to Achieve Goals: Fair    Frequency Min 2X/week   Barriers to discharge        Co-evaluation               End of Session Equipment  Utilized During Treatment: Gait belt Activity Tolerance: Patient tolerated treatment well Patient left: with chair alarm set;with call bell/phone within reach      Functional Assessment Tool Used:  (clinical judgement) Functional Limitation: Mobility: Walking and moving around Mobility: Walking and Moving Around Current Status [phone]): At least 20 percent but less than 40 percent impaired, limited or restricted Mobility: Walking and Moving Around Goal Status [phone]): At least 1 percent but less than 20 percent impaired, limited or restricted    Time: 1336-1401 PT Time Calculation (min) (ACUTE ONLY): 25 min   Charges:   PT Evaluation $PT Eval Low Complexity: 1 Procedure     PT G Codes:   PT G-Codes **NOT FOR INPATIENT CLASS** Functional Assessment Tool Used:  (clinical judgement) Functional Limitation: Mobility: Walking and moving around Mobility: Walking and Moving Around Current Status [phone]): At least 20 percent but less than 40 percent impaired, limited or restricted Mobility: Walking and Moving Around Goal Status [phone]): At least 1 percent but less than 20 percent impaired, limited or restricted   Wayne Both, PT, DPT [phone]  Kreg Shropshire 03/24/2015, 4:04 PM

## 2015-03-24 NOTE — Progress Notes (Signed)
PROGRESS NOTE  PATIENT NAME: Christine [name] DOB: [birth year]  MRN: [id number]   Subjective:  Christine [name] was able to rest comfortably last night. She received her IV antibiotics, and states that her finger feels less painful today however it remains swollen.  Her sensation has returned from the block.  She denies any fevers, night sweats, or progressive pain to the volar surface of her hand.  Objective: Vital signs in last 24 hours: Temp:  [98 F (36.7 C)-99.7 F (37.6 C)] 99.7 F (37.6 C) (03/11 0717) Pulse Rate:  [63-99] 80 (03/11 0717) Resp:  [16-20] 18 (03/11 0717) BP: (126-151)/(57-122) 142/64 mmHg (03/11 0717) SpO2:  [97 %-100 %] 97 % (03/11 0717) Weight:  [99.791 kg (220 lb)-105.235 kg (232 lb)] 105.235 kg (232 lb) (03/10 2035)  Intake/Output from previous day:     Recent Labs  03/23/15 2054 03/24/15 0344  WBC 10.8 7.7  HGB 10.3* 9.9*  HCT 30.7* 29.0*  PLT 250 231  K 3.1* 3.1*  CL 97* 102  CO2 28 26  BUN 13 13  CREATININE 0.98 0.90  GLUCOSE 183* 100*  CALCIUM 8.6* 8.3*    EXAM Left index finger  Continued swelling similar to when seen at 2 AM in the morning. However there is no obvious purulence anymore. Her sensation has returned to the distal tip of the finger. The fingers well perfused. No evidence of progressive cellulitis on the dorsum or volar surface of the finger.  A small needle was used to puncture the center of the finger to ensure that there is no purulence centrally. Only blood was expressed.      Assessment: Left index finger Dorsal felon with subsequent dorsal tracking infection and cellulitis.  Plan: Soaks to begin today.- Patient and nurse shown how to do it. Recommend vancomycin and cefepime for initial broad-spectrum antibiotics- until culture speciation Recommend compression elevation and ice every 2 hours for 30 minutes If the patient's kidney function is stable, recommend Toradol for inflammation control. I will  continue to monitor the wound. Strict blood glucose control.  I had a long discussion with the patient regarding her finger. As long as she does not have systemic symptoms, or progression to flexor tenosynovitis or worsening finger problems, the patient would like to give the finger a chance to stay viable. If either of the following above continue to progress, we would revisit discussion of amputation likely at the metacarpal phalangeal joint.    Principal Problem:   Cellulitis of index finger Active Problems:   HTN (hypertension)   HLD (hyperlipidemia)   GERD (gastroesophageal reflux disease)   Type 2 diabetes mellitus (HCC)   Juliene Pina, MD

## 2015-03-24 NOTE — ED Notes (Signed)
Dr. Jerline Pain in to see pt.

## 2015-03-24 NOTE — Progress Notes (Signed)
Patient arrived from ED. Alert and oriented. Dressing CDI. BL edema noted to lower ext. Patient states that she does not know the names of the medication she takes but she takes a "water pill". No acute distress noted.Pateint resting between care.

## 2015-03-24 NOTE — Progress Notes (Signed)
Wound care preformed. Finger edematous and discoloration noted throughout. Moderate drainage to dressing removed. Finger soaked for 29mis.New dressing applied CDI. Hand elevated on pillows. Pt tolerated well

## 2015-03-24 NOTE — Progress Notes (Addendum)
Lab has blood culture specimen but no order. Dr hower on rounds ordered  Culture. Pt not taking any diabetic meds at home . On carb diet. Dr hower reports he will d/c fsbs

## 2015-03-24 NOTE — ED Notes (Signed)
Called report to Jasmin on 1A and let her know that Christine [name] will be on the way after the orthopedist Dr Jerline Pain and Dr Jannifer Franklin both finish with their assessments. Will notify floor when pt leaves ER.

## 2015-03-24 NOTE — H&P (Signed)
[facility] [place] NAME: Christine [name]    MR#:  [id number]  DATE OF BIRTH:  May 03, 1932  DATE OF ADMISSION:  03/23/2015  PRIMARY CARE PHYSICIAN: No primary care provider on file.   REQUESTING/REFERRING PHYSICIAN: Paduchowski, MD  CHIEF COMPLAINT:   Chief Complaint  Patient presents with  . Hand Problem    HISTORY OF PRESENT ILLNESS:  Christine [name]  is a 82 y.o. female who presents with severely swollen, erythematous, painful left index finger.  She states that she began having some pain and then redness at the tip of that finger about 1 week ago.  Her symptoms progressed down her finger and it began swelling, until she decided to come to the ED today for evaluation.  Orthopedic surgery called and performed bedside extensive I&D.  Hospitalists called for admission for IV antibiotics.  PAST MEDICAL HISTORY:   Past Medical History  Diagnosis Date  . Hypertension   . HLD (hyperlipidemia)   . GERD (gastroesophageal reflux disease)   . Aortic insufficiency     PAST SURGICAL HISTORY:   Past Surgical History  Procedure Laterality Date  . Carpal tunnel release Bilateral   . Total knee arthroplasty Bilateral     SOCIAL HISTORY:   Social History  Substance Use Topics  . Smoking status: Former Research scientist (life sciences)  . Smokeless tobacco: Not on file  . Alcohol Use: No    FAMILY HISTORY:   Family History  Problem Relation Age of Onset  . Stroke Mother   . Alzheimer's disease Father   . Diabetes Sister     DRUG ALLERGIES:   Allergies  Allergen Reactions  . Ace Inhibitors   . Amlodipine   . Cardizem [Diltiazem Hcl]   . Penicillins   . Sulfa Antibiotics   . Triamterene-Hctz     MEDICATIONS AT HOME:   Prior to Admission medications   Not on File    REVIEW OF SYSTEMS:  Review of Systems  Constitutional: Negative for fever, chills, weight loss and malaise/fatigue.  HENT: Negative for ear pain, hearing loss and  tinnitus.   Eyes: Negative for blurred vision, double vision, pain and redness.  Respiratory: Negative for cough, hemoptysis and shortness of breath.   Cardiovascular: Negative for chest pain, palpitations, orthopnea and leg swelling.  Gastrointestinal: Negative for nausea, vomiting, abdominal pain, diarrhea and constipation.  Genitourinary: Negative for dysuria, frequency and hematuria.  Musculoskeletal: Positive for joint pain (left index finger). Negative for back pain and neck pain.  Skin:       Left index finger erythema and swelling.   Neurological: Negative for dizziness, tremors, focal weakness and weakness.  Endo/Heme/Allergies: Negative for polydipsia. Does not bruise/bleed easily.  Psychiatric/Behavioral: Negative for depression. The patient is not nervous/anxious and does not have insomnia.      VITAL SIGNS:   Filed Vitals:   03/23/15 2200 03/23/15 2230 03/23/15 2300 03/23/15 2335  BP: 127/66 131/59 133/58 133/58  Pulse: 66 63 64 67  Temp:      TempSrc:      Resp:    18  Height:      Weight:      SpO2: 100% 100% 98% 100%   Wt Readings from Last 3 Encounters:  03/23/15 105.235 kg (232 lb)  03/23/15 99.791 kg (220 lb)    PHYSICAL EXAMINATION:  Physical Exam  Vitals reviewed. Constitutional: She is oriented to person, place, and time. She appears well-developed and well-nourished. No distress.  HENT:  Head:  Normocephalic and atraumatic.  Mouth/Throat: Oropharynx is clear and moist.  Eyes: Conjunctivae and EOM are normal. Pupils are equal, round, and reactive to light. No scleral icterus.  Neck: Normal range of motion. Neck supple. No JVD present. No thyromegaly present.  Cardiovascular: Normal rate, regular rhythm and intact distal pulses.  Exam reveals no gallop and no friction rub.   No murmur heard. Respiratory: Effort normal and breath sounds normal. No respiratory distress. She has no wheezes. She has no rales.  GI: Soft. Bowel sounds are normal. She exhibits  no distension. There is no tenderness.  Musculoskeletal: Normal range of motion. She exhibits edema (1+ bilateral lower extremity).  Left index finger bandaged post procedure.  Lymphadenopathy:    She has no cervical adenopathy.  Neurological: She is alert and oriented to person, place, and time. No cranial nerve deficit.  No dysarthria, no aphasia  Skin: Skin is warm and dry. No rash noted. No erythema.  Psychiatric: She has a normal mood and affect. Her behavior is normal. Judgment and thought content normal.    LABORATORY PANEL:   CBC  Recent Labs Lab 03/23/15 2054  WBC 10.8  HGB 10.3*  HCT 30.7*  PLT 250   ------------------------------------------------------------------------------------------------------------------  Chemistries   Recent Labs Lab 03/23/15 2054  NA 134*  K 3.1*  CL 97*  CO2 28  GLUCOSE 183*  BUN 13  CREATININE 0.98  CALCIUM 8.6*   ------------------------------------------------------------------------------------------------------------------  Cardiac Enzymes No results for input(s): TROPONINI in the last 168 hours. ------------------------------------------------------------------------------------------------------------------  RADIOLOGY:  Dg Finger Index Left  03/23/2015  CLINICAL DATA:  Redness/swelling of 1st digit EXAM: LEFT INDEX FINGER 2+V COMPARISON:  None. FINDINGS: No fracture or dislocation is seen. The joint spaces are preserved.  No marginal erosions. Mild diffuse soft tissue swelling, most prominent dorsally. No radiopaque foreign body is seen. IMPRESSION: Mild diffuse soft tissue swelling. No fracture, dislocation, or radiopaque foreign body is seen. Electronically Signed   By: Julian Hy M.D.   On: 03/23/2015 18:24    EKG:   Orders placed or performed in visit on 11/07/12  . EKG 12-Lead    IMPRESSION AND PLAN:  Principal Problem:   Cellulitis of index finger - orthopedic surgery was consulted and performed  extensive I&D at the bedside in the ED. Antibiotics given preprocedure, we will put patient on Vanco and Zosyn for now until cultures come back. Active Problems:   HTN (hypertension) - currently stable, continue home medications   Type 2 diabetes mellitus ([place]) - prior diagnosis of the same found in chart review. Patient not on any medications for this. However her glucose is elevated in the ED today. We'll have her on carb modified diet and sliding scale insulin with corresponding glucose checks.   HLD (hyperlipidemia) - not on any home medications for this. We will have her on a heart healthy/carb modified diet here.   GERD (gastroesophageal reflux disease) - per chart review patient was on PPI at home, continue this here.  All the records are reviewed and case discussed with ED provider. Management plans discussed with the patient and/or family.  DVT PROPHYLAXIS: SubQ lovenox  GI PROPHYLAXIS: PPI  ADMISSION STATUS: Observation  CODE STATUS: Full Code Status History    This patient does not have a recorded code status. Please follow your organizational policy for patients in this situation.      TOTAL TIME TAKING CARE OF THIS PATIENT: 35 minutes.    Christine [name] [place] 03/24/2015, 1:22 AM  Christine [name]  [facility]  Office  [phone]  CC: Primary care physician; No primary care provider on file.

## 2015-03-24 NOTE — Care Management Obs Status (Signed)
[facility] NOTIFICATION   Patient Details  Name: Christine [name] MRN: [id number] Date of Birth: 09/08/1932   Medicare Observation Status Notification Given:  Yes (niece signed for patient  / cellulitis)    Ival Bible, RN 03/24/2015, 6:13 PM

## 2015-03-24 NOTE — Progress Notes (Signed)
Pt with infected left forefinger. Finger grossly edematous and tender from mid knuckle to tip of finger. Swelling noted on inside of hand. Finger soaked in 1/2 hibiclens/ 1/2 ns for 25 minutes then dried. Covered with xeroform,guaze and .conform and coban. Tolerated well

## 2015-03-24 NOTE — Progress Notes (Signed)
Pharmacy Antibiotic Note  Christine [name] is a 82 y.o. female admitted on 03/23/2015 with cellulitis.  Pharmacy has been consulted for vancomycin dosing.  Plan: DW 76kg  Vd 53 kei 0.05 hr-1  T1/2 14 hours Vancomycin 1250 mg q 18 hours ordered with stacked dosing. Level before 5th dose. Goal 10-15.  Pt also on cefepime.  Height: 5\' 5"  (165.1 cm) Weight: 232 lb (105.235 kg) IBW/kg (Calculated) : 57  Temp (24hrs), Avg:98.4 F (36.9 C), Min:98 F (36.7 C), Max:98.9 F (37.2 C)   Recent Labs Lab 03/23/15 2054 03/24/15 0344  WBC 10.8 7.7  CREATININE 0.98 0.90    Estimated Creatinine Clearance: 60.1 mL/min (by C-G formula based on Cr of 0.9).    Allergies  Allergen Reactions  . Sulfa Antibiotics Other (See Comments)    Reaction: unknown Patient states no allergies, but family states allergy  . Penicillins Rash and Other (See Comments)    Family states rash, patient states no allergies    Antimicrobials this admission: vanc  cefepime >>    >>   Dose adjustments this admission:   Microbiology results: No micro.  Thank you for allowing pharmacy to be a part of this patient's care.  Hazley Dezeeuw S 03/24/2015 4:55 AM

## 2015-03-25 ENCOUNTER — Encounter
Admission: EM | Disposition: A | Discharge status: Home / Self Care | Payer: Self-pay | Source: Home / Self Care | Attending: Emergency Medicine

## 2015-03-25 LAB — BLOOD CULTURE ID PANEL (REFLEXED)
ACINETOBACTER BAUMANNII: NOT DETECTED
CANDIDA ALBICANS: NOT DETECTED
CANDIDA GLABRATA: NOT DETECTED
CANDIDA KRUSEI: NOT DETECTED
Candida parapsilosis: NOT DETECTED
Candida tropicalis: NOT DETECTED
Carbapenem resistance: NOT DETECTED
ENTEROBACTER CLOACAE COMPLEX: NOT DETECTED
ESCHERICHIA COLI: NOT DETECTED
Enterobacteriaceae species: NOT DETECTED
Enterococcus species: NOT DETECTED
Haemophilus influenzae: NOT DETECTED
KLEBSIELLA OXYTOCA: NOT DETECTED
Klebsiella pneumoniae: NOT DETECTED
LISTERIA MONOCYTOGENES: NOT DETECTED
METHICILLIN RESISTANCE: NOT DETECTED
NEISSERIA MENINGITIDIS: NOT DETECTED
Proteus species: NOT DETECTED
Pseudomonas aeruginosa: NOT DETECTED
SERRATIA MARCESCENS: NOT DETECTED
STREPTOCOCCUS AGALACTIAE: NOT DETECTED
STREPTOCOCCUS PNEUMONIAE: NOT DETECTED
STREPTOCOCCUS SPECIES: NOT DETECTED
Staphylococcus aureus (BCID): NOT DETECTED
Staphylococcus species: DETECTED — AB
Streptococcus pyogenes: NOT DETECTED
Vancomycin resistance: NOT DETECTED

## 2015-03-25 SURGERY — AMPUTATION DIGIT
Anesthesia: General | Laterality: Left

## 2015-03-25 MED ORDER — CEPHALEXIN 500 MG PO CAPS
500.0000 mg | ORAL_CAPSULE | Freq: Four times a day (QID) | ORAL | Status: DC
  Administered 2015-03-25 – 2015-03-26 (×4): 500 mg via ORAL
  Filled 2015-03-25 (×5): qty 1

## 2015-03-25 MED ORDER — HYDROCODONE-ACETAMINOPHEN 5-325 MG PO TABS: 1.0000 | ORAL_TABLET | Freq: Four times a day (QID) | ORAL | Status: DC | PRN

## 2015-03-25 MED ORDER — CEPHALEXIN 500 MG PO CAPS: 500.0000 mg | ORAL_CAPSULE | Freq: Two times a day (BID) | ORAL | Status: DC

## 2015-03-25 SURGICAL SUPPLY — 21 items
BNDG ESMARK 4X12 TAN STRL LF (GAUZE/BANDAGES/DRESSINGS) ×3 IMPLANT
CANISTER SUCT 1200ML W/VALVE (MISCELLANEOUS) ×3 IMPLANT
ELECT REM PT RETURN 9FT ADLT (ELECTROSURGICAL) ×3
ELECTRODE REM PT RTRN 9FT ADLT (ELECTROSURGICAL) ×1 IMPLANT
GAUZE PETRO XEROFOAM 1X8 (MISCELLANEOUS) ×3 IMPLANT
GAUZE SPONGE 4X4 12PLY STRL (GAUZE/BANDAGES/DRESSINGS) ×3 IMPLANT
GAUZE STRETCH 2X75IN STRL (MISCELLANEOUS) ×3 IMPLANT
GLOVE BIOGEL PI IND STRL 9 (GLOVE) ×1 IMPLANT
GLOVE BIOGEL PI INDICATOR 9 (GLOVE) ×2
GLOVE SURG ORTHO 9.0 STRL STRW (GLOVE) ×3 IMPLANT
GOWN SPECIALTY ULTRA XL (MISCELLANEOUS) ×3 IMPLANT
GOWN STRL REUS W/ TWL LRG LVL3 (GOWN DISPOSABLE) ×1 IMPLANT
GOWN STRL REUS W/TWL LRG LVL3 (GOWN DISPOSABLE) ×2
KIT RM TURNOVER STRD PROC AR (KITS) ×3 IMPLANT
NEEDLE HYPO 25X1 1.5 SAFETY (NEEDLE) ×3 IMPLANT
NS IRRIG 500ML POUR BTL (IV SOLUTION) ×3 IMPLANT
PACK EXTREMITY ARMC (MISCELLANEOUS) IMPLANT
STOCKINETTE STRL 6IN 960660 (GAUZE/BANDAGES/DRESSINGS) ×3 IMPLANT
SUT ETHILON 4-0 (SUTURE)
SUT ETHILON 4-0 FS2 18XMFL BLK (SUTURE)
SUTURE ETHLN 4-0 FS2 18XMF BLK (SUTURE) IMPLANT

## 2015-03-25 NOTE — Discharge Summary (Deleted)
Christine [name] NAME: Christine [name]    MR#:  [id number]  DATE OF BIRTH:  1933-01-06  DATE OF ADMISSION:  03/23/2015 ADMITTING PHYSICIAN: Lance Coon, MD  DATE OF DISCHARGE: No discharge date for patient encounter.  PRIMARY CARE PHYSICIAN: No primary care provider on file.    ADMISSION DIAGNOSIS:  Abscess of left hand [L02.512] Cellulitis of finger of left hand [L03.012]  DISCHARGE DIAGNOSIS:  Principal Problem:   Cellulitis of index finger Active Problems:   HTN (hypertension)   HLD (hyperlipidemia)   GERD (gastroesophageal reflux disease)   Type 2 diabetes mellitus ([place])   SECONDARY DIAGNOSIS:   Past Medical History  Diagnosis Date  . Hypertension   . HLD (hyperlipidemia)   . GERD (gastroesophageal reflux disease)   . Aortic insufficiency   . Peripheral edema     HOSPITAL COURSE:  Christine [name]  is a 82 y.o. female admitted 03/23/2015 with chief complaint left hand pain and swelling. On presentation to the hospital. Patient noted to have cellulitis of left hand index finger underwent incision and drainage in emergency department Place on broad antibiotics. She has had improvement of symptoms, culture data returned staph (non-MRSA) antibiotics downgraded patient stable for discharge  DISCHARGE CONDITIONS:   Stable  CONSULTS OBTAINED:  Treatment Team:  Juliene Pina, MD  DRUG ALLERGIES:   Allergies  Allergen Reactions  . Sulfa Antibiotics Other (See Comments)    Reaction: unknown Patient states no allergies, but family states allergy  . Penicillins Rash and Other (See Comments)    Family states rash, patient states no allergies    DISCHARGE MEDICATIONS:   Current Discharge Medication List    START taking these medications   Details  cephALEXin (KEFLEX) 500 MG capsule Take 1 capsule (500 mg total) by mouth 2 (two) times daily. Qty: 14 capsule, Refills: 0    HYDROcodone-acetaminophen  (NORCO) 5-325 MG tablet Take 1 tablet by mouth every 6 (six) hours as needed for moderate pain. Qty: 30 tablet, Refills: 0      CONTINUE these medications which have NOT CHANGED   Details  mometasone (ELOCON) 0.1 % lotion Apply 4 drops topically at bedtime as needed. Drops placed in each ear Refills: 1    olmesartan (BENICAR) 40 MG tablet Take 40 mg by mouth daily.    omeprazole (PRILOSEC) 40 MG capsule Take 40 mg by mouth daily.    torsemide (DEMADEX) 20 MG tablet Take 20 mg by mouth 2 (two) times daily.         DISCHARGE INSTRUCTIONS:    DIET:  Diabetic diet  DISCHARGE CONDITION:  Good  ACTIVITY:  Activity as tolerated  OXYGEN:  Home Oxygen: No.   Oxygen Delivery: room air  DISCHARGE LOCATION:  home   If you experience worsening of your admission symptoms, develop shortness of breath, life threatening emergency, suicidal or homicidal thoughts you must seek medical attention immediately by calling 911 or calling your MD immediately  if symptoms less severe.  You Must read complete instructions/literature along with all the possible adverse reactions/side effects for all the Medicines you take and that have been prescribed to you. Take any new Medicines after you have completely understood and accpet all the possible adverse reactions/side effects.   Please note  You were cared for by a hospitalist during your hospital stay. If you have any questions about your discharge medications or the care you received while you were in the hospital after you  are discharged, you can call the unit and asked to speak with the hospitalist on call if the hospitalist that took care of you is not available. Once you are discharged, your primary care physician will handle any further medical issues. Please note that NO REFILLS for any discharge medications will be authorized once you are discharged, as it is imperative that you return to your primary care physician (or establish a  relationship with a primary care physician if you do not have one) for your aftercare needs so that they can reassess your need for medications and monitor your lab values.    On the day of Discharge:   VITAL SIGNS:  Blood pressure 157/56, pulse 51, temperature 98 F (36.7 C), temperature source Oral, resp. rate 16, height 5\' 5"  (1.651 m), weight 105.235 kg (232 lb), SpO2 100 %.  I/O:   Intake/Output Summary (Last 24 hours) at 03/25/15 1029 Last data filed at 03/25/15 0400  Gross per 24 hour  Intake   1010 ml  Output    700 ml  Net    310 ml    PHYSICAL EXAMINATION:  GENERAL:  82 y.o.-year-old patient lying in the bed with no acute distress.  EYES: Pupils equal, round, reactive to light and accommodation. No scleral icterus. Extraocular muscles intact.  HEENT: Head atraumatic, normocephalic. Oropharynx and nasopharynx clear.  NECK:  Supple, no jugular venous distention. No thyroid enlargement, no tenderness.  LUNGS: Normal breath sounds bilaterally, no wheezing, rales,rhonchi or crepitation. No use of accessory muscles of respiration.  CARDIOVASCULAR: S1, S2 normal. No murmurs, rubs, or gallops.  ABDOMEN: Soft, non-tender, non-distended. Bowel sounds present. No organomegaly or mass.  EXTREMITIES: No pedal edema, cyanosis, or clubbing. Left hand dressing clean dry and intact swelling markedly decreased NEUROLOGIC: Cranial nerves II through XII are intact. Muscle strength 5/5 in all extremities. Sensation intact. Gait not checked.  PSYCHIATRIC: The patient is alert and oriented x 3.  SKIN: No obvious rash, lesion, or ulcer.   DATA REVIEW:   CBC  Recent Labs Lab 03/24/15 0344  WBC 7.7  HGB 9.9*  HCT 29.0*  PLT 231    Chemistries   Recent Labs Lab 03/24/15 0344  NA 136  K 3.1*  CL 102  CO2 26  GLUCOSE 100*  BUN 13  CREATININE 0.90  CALCIUM 8.3*    Cardiac Enzymes No results for input(s): TROPONINI in the last 168 hours.  Microbiology Results  Results for  orders placed or performed during the hospital encounter of 03/23/15  CULTURE, BLOOD (ROUTINE X 2) w Reflex to PCR ID Panel     Status: None (Preliminary result)   Collection Time: 03/23/15  8:54 PM  Result Value Ref Range Status   Specimen Description BLOOD RIGHT ASSIST CONTROL  Final   Special Requests BOTTLES DRAWN AEROBIC AND ANAEROBIC 5CC  Final   Culture  Setup Time   Final    GRAM POSITIVE COCCI AEROBIC BOTTLE ONLY CRITICAL RESULT CALLED TO, READ BACK BY AND VERIFIED WITH: CRYSTAL [name] 03/25/15 0910 MLM Organism ID to follow    Culture   Final    STAPHYLOCOCCUS SPECIES AEROBIC BOTTLE ONLY IDENTIFICATION TO FOLLOW    Report Status PENDING  Incomplete  Blood Culture ID Panel (Reflexed)     Status: Abnormal   Collection Time: 03/23/15  8:54 PM  Result Value Ref Range Status   Enterococcus species NOT DETECTED NOT DETECTED Final   Vancomycin resistance NOT DETECTED NOT DETECTED Final   Listeria monocytogenes NOT  DETECTED NOT DETECTED Final   Staphylococcus species DETECTED (A) NOT DETECTED Final    Comment: CRITICAL RESULT CALLED TO, READ BACK BY AND VERIFIED WITH: CRYSTAL SCARPENA 03/25/15 0910 MLM    Staphylococcus aureus NOT DETECTED NOT DETECTED Final   Methicillin resistance NOT DETECTED NOT DETECTED Final   Streptococcus species NOT DETECTED NOT DETECTED Final   Streptococcus agalactiae NOT DETECTED NOT DETECTED Final   Streptococcus pneumoniae NOT DETECTED NOT DETECTED Final   Streptococcus pyogenes NOT DETECTED NOT DETECTED Final   Acinetobacter baumannii NOT DETECTED NOT DETECTED Final   Enterobacteriaceae species NOT DETECTED NOT DETECTED Final   Enterobacter cloacae complex NOT DETECTED NOT DETECTED Final   Escherichia coli NOT DETECTED NOT DETECTED Final   Klebsiella oxytoca NOT DETECTED NOT DETECTED Final   Klebsiella pneumoniae NOT DETECTED NOT DETECTED Final   Proteus species NOT DETECTED NOT DETECTED Final   Serratia marcescens NOT DETECTED NOT DETECTED  Final   Carbapenem resistance NOT DETECTED NOT DETECTED Final   Haemophilus influenzae NOT DETECTED NOT DETECTED Final   Neisseria meningitidis NOT DETECTED NOT DETECTED Final   Pseudomonas aeruginosa NOT DETECTED NOT DETECTED Final   Candida albicans NOT DETECTED NOT DETECTED Final   Candida glabrata NOT DETECTED NOT DETECTED Final   Candida krusei NOT DETECTED NOT DETECTED Final   Candida parapsilosis NOT DETECTED NOT DETECTED Final   Candida tropicalis NOT DETECTED NOT DETECTED Final  Wound culture     Status: None (Preliminary result)   Collection Time: 03/24/15  2:21 AM  Result Value Ref Range Status   Specimen Description SKIN  Final   Special Requests NONE  Final   Gram Stain   Final    MODERATE RED BLOOD CELLS MANY WBC SEEN RARE GRAM POSITIVE COCCI IN PAIRS IN CLUSTERS    Culture PENDING  Incomplete   Report Status PENDING  Incomplete  Wound culture     Status: None (Preliminary result)   Collection Time: 03/24/15  2:21 AM  Result Value Ref Range Status   Specimen Description SKIN  Final   Special Requests NONE  Final   Gram Stain   Final    FEW RED BLOOD CELLS RARE WBC SEEN RARE GRAM POSITIVE COCCI    Culture PENDING  Incomplete   Report Status PENDING  Incomplete    RADIOLOGY:  Dg Finger Index Left  03/23/2015  CLINICAL DATA:  Redness/swelling of 1st digit EXAM: LEFT INDEX FINGER 2+V COMPARISON:  None. FINDINGS: No fracture or dislocation is seen. The joint spaces are preserved.  No marginal erosions. Mild diffuse soft tissue swelling, most prominent dorsally. No radiopaque foreign body is seen. IMPRESSION: Mild diffuse soft tissue swelling. No fracture, dislocation, or radiopaque foreign body is seen. Electronically Signed   By: Julian Hy M.D.   On: 03/23/2015 18:24     Management plans discussed with the patient, family and they are in agreement.  CODE STATUS:     Code Status Orders        Start     Ordered   03/24/15 0337  Full code    Continuous     03/24/15 0336    Code Status History    Date Active Date Inactive Code Status Order ID Comments User Context   This patient has a current code status but no historical code status.      TOTAL TIME TAKING CARE OF THIS PATIENT: 28 minutes.    Hower,  Karenann Cai.D on 03/25/2015 at 10:29 AM  Between 7am  to 6pm - Pager - [phone]  After 6pm go to [email or web address] - password [email or web address]  [facility]  Office  [phone]  CC: Primary care physician; No primary care provider on file.

## 2015-03-25 NOTE — Progress Notes (Signed)
PROGRESS NOTE  PATIENT NAME: Christine [name] DOB: May 04, 1932  MRN: [id number]   Subjective:  Christine [name] is doing well. She completed to finger soaks yesterday. She states the pain in her finger is yielding quite a bit. Yesterday we had long discussions regarding the potential need for her to require an amputation. At this time she would rather wait to see if her finger improves. I reinforced with her that this is okay as long she has no increasing symptoms or systemic infection symptoms.  No fevers, no tracking pain in her anterior hand.   Objective: Vital signs in last 24 hours: Temp:  [97.8 F (36.6 C)-99.2 F (37.3 C)] 97.8 F (36.6 C) (03/12 0446) Pulse Rate:  [56-70] 56 (03/12 0446) Resp:  [18] 18 (03/12 0446) BP: (99-121)/(52-56) 119/56 mmHg (03/12 0446) SpO2:  [100 %] 100 % (03/12 0446)  Intake/Output from previous day: 03/11 0701 - 03/12 0700 In: 1370 [P.O.:1320; IV Piggyback:50] Out: 700 [Urine:700]   Recent Labs  03/23/15 2054 03/24/15 0344  WBC 10.8 7.7  HGB 10.3* 9.9*  HCT 30.7* 29.0*  PLT 250 231  K 3.1* 3.1*  CL 97* 102  CO2 28 26  BUN 13 13  CREATININE 0.98 0.90  GLUCOSE 183* 100*  CALCIUM 8.6* 8.3*    EXAM Left index finger  Mild decrease in overall swelling No purulence noted Wound was again milked revealing only a mild amount of discharge. Packing was backed out a little bit Cellulitic appearance has decreased a little bit from the distance from the metacarpal phalangeal joint. Capillary refill less than 2 seconds Sensation intact distally per patient      Assessment: Left index finger Dorsal felon with subsequent dorsal tracking infection and cellulitis.  Plan: Soaks BID patient and nurse shown how to do it. Recommend vancomycin and cefepime for initial broad-spectrum antibiotics- until culture speciation Once speciation has been reached, start oral antibiotics  Recommend compression elevation and ice every 2 hours for 30  minutes If the patient's kidney function is stable, recommend Toradol for inflammation control. Strict blood glucose control.  Disposition - at this time our plan will be to discharge Christine [name] once an appropriate oral antibiotic has been chosen. It is still possible upon discharge that she will eventually require an amputation of her finger. She should have close follow-up with a hand surgeon with regards to overall definitive management.      Principal Problem:   Cellulitis of index finger Active Problems:   HTN (hypertension)   HLD (hyperlipidemia)   GERD (gastroesophageal reflux disease)   Type 2 diabetes mellitus ([facility])   Juliene Pina, MD

## 2015-03-25 NOTE — Progress Notes (Signed)
Pharmacy Antibiotic Note  Christine [name] is a 82 y.o. female admitted on 03/23/2015 with cellulitis.  Pharmacy has been consulted for keflex dosing.  Plan: BCID results were discussed with Dr. Lavetta Nielsen and he would like to transition patient to oral cephalexin from IV Vancomycin and Cefepime.  Will initiate patient on cephalexin 500 mg po q6h based on indication and renal function.  Height: 5\' 5"  (165.1 cm) Weight: 232 lb (105.235 kg) IBW/kg (Calculated) : 57  Temp (24hrs), Avg:98.4 F (36.9 C), Min:97.8 F (36.6 C), Max:99.2 F (37.3 C)   Recent Labs Lab 03/23/15 2054 03/24/15 0344  WBC 10.8 7.7  CREATININE 0.98 0.90    Estimated Creatinine Clearance: 60.1 mL/min (by C-G formula based on Cr of 0.9).    Allergies  Allergen Reactions  . Sulfa Antibiotics Other (See Comments)    Reaction: unknown Patient states no allergies, but family states allergy  . Penicillins Rash and Other (See Comments)    Family states rash, patient states no allergies    Antimicrobials this admission: Anti-infectives    Start     Dose/Rate Route Frequency Ordered Stop   03/25/15 0930  cephALEXin (KEFLEX) capsule 500 mg     500 mg Oral 4 times per day 03/25/15 0929     03/24/15 1600  vancomycin (VANCOCIN) 1,250 mg in sodium chloride 0.9 % 250 mL IVPB  Status:  Discontinued     1,250 mg 166.7 mL/hr over 90 Minutes Intravenous Every 18 hours 03/24/15 0430 03/25/15 0928   03/24/15 1000  ceFEPIme (MAXIPIME) 2 g in dextrose 5 % 50 mL IVPB  Status:  Discontinued     2 g 100 mL/hr over 30 Minutes Intravenous Every 12 hours 03/24/15 0454 03/25/15 0928   03/24/15 0600  vancomycin (VANCOCIN) 1,250 mg in sodium chloride 0.9 % 250 mL IVPB     1,250 mg 166.7 mL/hr over 90 Minutes Intravenous  Once 03/24/15 0430 03/24/15 0948   03/24/15 0015  ceFAZolin (ANCEF) IVPB 2 g/50 mL premix     2 g 100 mL/hr over 30 Minutes Intravenous  Once 03/24/15 0012 03/24/15 0149      Microbiology results: Results for  orders placed or performed during the hospital encounter of 03/23/15  CULTURE, BLOOD (ROUTINE X 2) w Reflex to PCR ID Panel     Status: None (Preliminary result)   Collection Time: 03/23/15  8:54 PM  Result Value Ref Range Status   Specimen Description BLOOD RIGHT ASSIST CONTROL  Final   Special Requests BOTTLES DRAWN AEROBIC AND ANAEROBIC 5CC  Final   Culture  Setup Time   Final    GRAM POSITIVE COCCI AEROBIC BOTTLE ONLY CRITICAL RESULT CALLED TO, READ BACK BY AND VERIFIED WITH: Keisi [facility] 03/25/15 0910 MLM Organism ID to follow    Culture   Final    STAPHYLOCOCCUS SPECIES AEROBIC BOTTLE ONLY IDENTIFICATION TO FOLLOW    Report Status PENDING  Incomplete  Blood Culture ID Panel (Reflexed)     Status: Abnormal   Collection Time: 03/23/15  8:54 PM  Result Value Ref Range Status   Enterococcus species NOT DETECTED NOT DETECTED Final   Vancomycin resistance NOT DETECTED NOT DETECTED Final   Listeria monocytogenes NOT DETECTED NOT DETECTED Final   Staphylococcus species DETECTED (A) NOT DETECTED Final    Comment: CRITICAL RESULT CALLED TO, READ BACK BY AND VERIFIED WITH: Jaunita Mikels 03/25/15 0910 MLM    Staphylococcus aureus NOT DETECTED NOT DETECTED Final   Methicillin resistance NOT DETECTED NOT DETECTED  Final   Streptococcus species NOT DETECTED NOT DETECTED Final   Streptococcus agalactiae NOT DETECTED NOT DETECTED Final   Streptococcus pneumoniae NOT DETECTED NOT DETECTED Final   Streptococcus pyogenes NOT DETECTED NOT DETECTED Final   Acinetobacter baumannii NOT DETECTED NOT DETECTED Final   Enterobacteriaceae species NOT DETECTED NOT DETECTED Final   Enterobacter cloacae complex NOT DETECTED NOT DETECTED Final   Escherichia coli NOT DETECTED NOT DETECTED Final   Klebsiella oxytoca NOT DETECTED NOT DETECTED Final   Klebsiella pneumoniae NOT DETECTED NOT DETECTED Final   Proteus species NOT DETECTED NOT DETECTED Final   Serratia marcescens NOT DETECTED NOT DETECTED  Final   Carbapenem resistance NOT DETECTED NOT DETECTED Final   Haemophilus influenzae NOT DETECTED NOT DETECTED Final   Neisseria meningitidis NOT DETECTED NOT DETECTED Final   Pseudomonas aeruginosa NOT DETECTED NOT DETECTED Final   Candida albicans NOT DETECTED NOT DETECTED Final   Candida glabrata NOT DETECTED NOT DETECTED Final   Candida krusei NOT DETECTED NOT DETECTED Final   Candida parapsilosis NOT DETECTED NOT DETECTED Final   Candida tropicalis NOT DETECTED NOT DETECTED Final  Wound culture     Status: None (Preliminary result)   Collection Time: 03/24/15  2:21 AM  Result Value Ref Range Status   Specimen Description SKIN  Final   Special Requests NONE  Final   Gram Stain   Final    MODERATE RED BLOOD CELLS MANY WBC SEEN RARE GRAM POSITIVE COCCI IN PAIRS IN CLUSTERS    Culture PENDING  Incomplete   Report Status PENDING  Incomplete  Wound culture     Status: None (Preliminary result)   Collection Time: 03/24/15  2:21 AM  Result Value Ref Range Status   Specimen Description SKIN  Final   Special Requests NONE  Final   Gram Stain   Final    FEW RED BLOOD CELLS RARE WBC SEEN RARE GRAM POSITIVE COCCI    Culture PENDING  Incomplete   Report Status PENDING  Incomplete    Thank you for allowing pharmacy to be a part of this patient's care.  Eufemio Strahm G 03/25/2015 9:30 AM

## 2015-03-25 NOTE — Progress Notes (Signed)
[facility] [place] NAME: Jadon Alward    MR#:  [id number]  DATE OF BIRTH:  [birth year]  SUBJECTIVE:  Denies pain and fevers chills Symptomatically improved Somewhat confused  REVIEW OF SYSTEMS:  CONSTITUTIONAL: No fever, fatigue or weakness.  EYES: No blurred or double vision.  EARS, NOSE, AND THROAT: No tinnitus or ear pain.  RESPIRATORY: No cough, shortness of breath, wheezing or hemoptysis.  CARDIOVASCULAR: No chest pain, orthopnea, edema.  GASTROINTESTINAL: No nausea, vomiting, diarrhea or abdominal pain.  GENITOURINARY: No dysuria, hematuria.  ENDOCRINE: No polyuria, nocturia,  HEMATOLOGY: No anemia, easy bruising or bleeding SKIN: No rash or lesion. MUSCULOSKELETAL: No joint pain or arthritis.  Positive left finger pain NEUROLOGIC: No tingling, numbness, weakness.  PSYCHIATRY: No anxiety or depression.   DRUG ALLERGIES:   Allergies  Allergen Reactions  . Sulfa Antibiotics Other (See Comments)    Reaction: unknown Patient states no allergies, but family states allergy  . Penicillins Rash and Other (See Comments)    Family states rash, patient states no allergies    VITALS:  Blood pressure 157/56, pulse 51, temperature 98 F (36.7 C), temperature source Oral, resp. rate 16, height 5\' 5"  (1.651 m), weight 105.235 kg (232 lb), SpO2 100 %.  PHYSICAL EXAMINATION:  VITAL SIGNS: Filed Vitals:   03/25/15 0446 03/25/15 0812  BP: 119/56 157/56  Pulse: 56 51  Temp: 97.8 F (36.6 C) 98 F (36.7 C)  Resp: 18 16   GENERAL:82 y.o.female currently in no acute distress.  HEAD: Normocephalic, atraumatic.  EYES: Pupils equal, round, reactive to light. Extraocular muscles intact. No scleral icterus.  MOUTH: Moist mucosal membrane. Dentition intact. No abscess noted.  EAR, NOSE, THROAT: Clear without exudates. No external lesions.  NECK: Supple. No thyromegaly. No nodules. No JVD.  PULMONARY: Clear to ascultation,  without wheeze rails or rhonci. No use of accessory muscles, Good respiratory effort. good air entry bilaterally CHEST: Nontender to palpation.  CARDIOVASCULAR: S1 and S2. Regular rate and rhythm. No murmurs, rubs, or gallops. No edema. Pedal pulses 2+ bilaterally.  GASTROINTESTINAL: Soft, nontender, nondistended. No masses. Positive bowel sounds. No hepatosplenomegaly.  MUSCULOSKELETAL: No swelling, clubbing, or edema. Range of motion full in all extremities. Left hand wrapped in gauze dressing clean dry and intact NEUROLOGIC: Cranial nerves II through XII are intact. No gross focal neurological deficits. Sensation intact. Reflexes intact.  SKIN: No ulceration, lesions, rashes, or cyanosis. Skin warm and dry. Turgor intact.  PSYCHIATRIC: Mood, affect within normal limits. The patient is awake, alert and oriented x 3. Insight, judgment intact.      LABORATORY PANEL:   CBC  Recent Labs Lab 03/24/15 0344  WBC 7.7  HGB 9.9*  HCT 29.0*  PLT 231   ------------------------------------------------------------------------------------------------------------------  Chemistries   Recent Labs Lab 03/24/15 0344  NA 136  K 3.1*  CL 102  CO2 26  GLUCOSE 100*  BUN 13  CREATININE 0.90  CALCIUM 8.3*   ------------------------------------------------------------------------------------------------------------------  Cardiac Enzymes No results for input(s): TROPONINI in the last 168 hours. ------------------------------------------------------------------------------------------------------------------  RADIOLOGY:  Dg Finger Index Left  03/23/2015  CLINICAL DATA:  Redness/swelling of 1st digit EXAM: LEFT INDEX FINGER 2+V COMPARISON:  None. FINDINGS: No fracture or dislocation is seen. The joint spaces are preserved.  No marginal erosions. Mild diffuse soft tissue swelling, most prominent dorsally. No radiopaque foreign body is seen. IMPRESSION: Mild diffuse soft tissue swelling. No  fracture, dislocation, or radiopaque foreign body is seen. Electronically Signed  By: Julian Hy M.D.   On: 03/23/2015 18:24    EKG:   Orders placed or performed in visit on 11/07/12  . EKG 12-Lead    ASSESSMENT AND PLAN:   82 year old African-American female admitted 03/23/2015 with left hand cellulitis  1. Left hand cellulitis: Orthopedic input appreciated -downgrade antibiotics to Keflex 2. GERD without esophagitis PPI therapy 3. Hypokalemia: Replace 4. Venous thrombi embolism prophylactic Lovenox  Disposition: Home with home health Patient medically stable for discharge however patient's primary home giver is currently in the hospital delaying discharge-if caregivers will be discharged today versus tomorrow will proceed with discharge   All the records are reviewed and case discussed with Care Management/Social Workerr. Management plans discussed with the patient, family and they are in agreement.  CODE STATUS: Full  TOTAL TIME TAKING CARE OF THIS PATIENT: 28 minutes.   POSSIBLE D/C IN1 DAYS, DEPENDING ON CLINICAL CONDITION.   Hadden Steig,  Karenann Cai.D on 03/25/2015 at 12:04 PM  Between 7am to 6pm - Pager - [phone]  After 6pm: House Pager: - [phone]  [facility]  Office  [phone]  CC: Primary care physician; Halina Maidens, MD

## 2015-03-25 NOTE — Progress Notes (Signed)
Pt is forgetful. Lives with dtr who is a pt also in hospital at this time. dtr is primary cger. High risk for fall home alone. Dr hower notified of unsafe discharge

## 2015-03-25 NOTE — Care Management Note (Signed)
Case Management Note  Patient Details  Name: Christine [name] MRN: [id number] Date of Birth: 11-18-1932  Subjective/Objective:       Attempted to discuss discharge planning with Mrs Easterbrook who appears to be confused. Unable to comprehend the concept of a nurse coming to her home. Kept repeating over and over "I just don't know." during our conversation the hospital cafeteria called and Mrs Boender hung up on them and said to this Probation officer "They want to eat lunch with me. I don't even know who they are." This Probation officer discussed discharge planning with Dr Lavetta Nielsen. Mrs Roache's daughter, who is her primary caretaker, is currently an inpatient at [facility].  Mrs Munsch chose [facility] as her home health provider stating "I guess it will be okay. My daughter looks after me." A referral was faxed to [facility] for PT, RN, Aid.            Action/Plan:   Expected Discharge Date:                  Expected Discharge Plan:     In-House Referral:     Discharge planning Services     Post Acute Care Choice:    Choice offered to:     DME Arranged:    DME Agency:     HH Arranged:    [place] Agency:     Status of Service:     Medicare Important Message Given:    Date Medicare IM Given:    Medicare IM give by:    Date Additional Medicare IM Given:    Additional Medicare Important Message give by:     If discussed at [place] of Stay Meetings, dates discussed:    Additional Comments:  Jahniya Duzan A, RN 03/25/2015, 10:44 AM

## 2015-03-25 NOTE — Progress Notes (Addendum)
Left forefinger less tender and swelling today. rn able to wrap dressing without distress. No swelling in palm of hand today. Wound dressed per md protocol after 1/2 hibiclens/ns soaking for 30 minutes. Pulse rate 56. Asx. rn spoke with dr hower who acknowledged and cleared adm of avapro

## 2015-03-26 NOTE — Progress Notes (Signed)
Patient taken to visitors entrance via wheelchair to be taken home by family member in personal vehicle.

## 2015-03-26 NOTE — Discharge Summary (Addendum)
[facility] [place] NAME: Christine [name]    MR#:  [id number]  DATE OF BIRTH:  08-12-1932  DATE OF ADMISSION:  03/23/2015 ADMITTING PHYSICIAN: Lance Coon, MD  DATE OF DISCHARGE: No discharge date for patient encounter.  PRIMARY CARE PHYSICIAN: Halina Maidens, MD    ADMISSION DIAGNOSIS:  Abscess of left hand [L02.512] Cellulitis of finger of left hand [id number]  DISCHARGE DIAGNOSIS:  Principal Problem:   Cellulitis of index finger Active Problems:   HTN (hypertension)   HLD (hyperlipidemia)   GERD (gastroesophageal reflux disease)   Type 2 diabetes mellitus ([place])   SECONDARY DIAGNOSIS:   Past Medical History  Diagnosis Date  . Hypertension   . HLD (hyperlipidemia)   . GERD (gastroesophageal reflux disease)   . Aortic insufficiency   . Peripheral edema     HOSPITAL COURSE:  Christine [name]  is a 82 y.o. female admitted 03/23/2015 with chief complaint left hand pain and swelling. On presentation to the hospital. Patient noted to have cellulitis of left hand index finger underwent incision and drainage in emergency department Place on broad antibiotics. She has had improvement of symptoms, culture data returned staph (non-MRSA) antibiotics downgraded patient stable for discharge  DISCHARGE CONDITIONS:   Stable  CONSULTS OBTAINED:  Treatment Team:  Juliene Pina, MD  DRUG ALLERGIES:   Allergies  Allergen Reactions  . Sulfa Antibiotics Other (See Comments)    Reaction: unknown Patient states no allergies, but family states allergy  . Penicillins Rash and Other (See Comments)    Family states rash, patient states no allergies    DISCHARGE MEDICATIONS:   Current Discharge Medication List    START taking these medications   Details  cephALEXin (KEFLEX) 500 MG capsule Take 1 capsule (500 mg total) by mouth 2 (two) times daily. Qty: 14 capsule, Refills: 0    HYDROcodone-acetaminophen (NORCO) 5-325 MG  tablet Take 1 tablet by mouth every 6 (six) hours as needed for moderate pain. Qty: 30 tablet, Refills: 0      CONTINUE these medications which have NOT CHANGED   Details  mometasone (ELOCON) 0.1 % lotion Apply 4 drops topically at bedtime as needed. Drops placed in each ear Refills: 1    olmesartan (BENICAR) 40 MG tablet Take 40 mg by mouth daily.    omeprazole (PRILOSEC) 40 MG capsule Take 40 mg by mouth daily.    torsemide (DEMADEX) 20 MG tablet Take 20 mg by mouth 2 (two) times daily.         DISCHARGE INSTRUCTIONS:  Wound care - twice daily 1. Remove dressing from finger 2. Soak the finger in a bath of normal saline and a mixture of chlorhexidine soap for 30 minutes 3. Replace with dry dressing and compression wrap  DIET:  Diabetic diet  DISCHARGE CONDITION:  Good  ACTIVITY:  Activity as tolerated  OXYGEN:  Home Oxygen: No.   Oxygen Delivery: room air  DISCHARGE LOCATION:  home   If you experience worsening of your admission symptoms, develop shortness of breath, life threatening emergency, suicidal or homicidal thoughts you must seek medical attention immediately by calling 911 or calling your MD immediately  if symptoms less severe.  You Must read complete instructions/literature along with all the possible adverse reactions/side effects for all the Medicines you take and that have been prescribed to you. Take any new Medicines after you have completely understood and accpet all the possible adverse reactions/side effects.   Please note  You were cared for by a hospitalist during your hospital stay. If you have any questions about your discharge medications or the care you received while you were in the hospital after you are discharged, you can call the unit and asked to speak with the hospitalist on call if the hospitalist that took care of you is not available. Once you are discharged, your primary care physician will handle any further medical issues. Please  note that NO REFILLS for any discharge medications will be authorized once you are discharged, as it is imperative that you return to your primary care physician (or establish a relationship with a primary care physician if you do not have one) for your aftercare needs so that they can reassess your need for medications and monitor your lab values.    On the day of Discharge:   VITAL SIGNS:  Blood pressure 109/57, pulse 69, temperature 98.7 F (37.1 C), temperature source Oral, resp. rate 17, height 5\' 5"  (1.651 m), weight 105.235 kg (232 lb), SpO2 100 %.  I/O:    Intake/Output Summary (Last 24 hours) at 03/26/15 0916 Last data filed at 03/25/15 1755  Gross per 24 hour  Intake    480 ml  Output      0 ml  Net    480 ml    PHYSICAL EXAMINATION:  GENERAL:  82 y.o.-year-old patient lying in the bed with no acute distress.  EYES: Pupils equal, round, reactive to light and accommodation. No scleral icterus. Extraocular muscles intact.  HEENT: Head atraumatic, normocephalic. Oropharynx and nasopharynx clear.  NECK:  Supple, no jugular venous distention. No thyroid enlargement, no tenderness.  LUNGS: Normal breath sounds bilaterally, no wheezing, rales,rhonchi or crepitation. No use of accessory muscles of respiration.  CARDIOVASCULAR: S1, S2 normal. No murmurs, rubs, or gallops.  ABDOMEN: Soft, non-tender, non-distended. Bowel sounds present. No organomegaly or mass.  EXTREMITIES: No pedal edema, cyanosis, or clubbing. Left hand dressing clean dry and intact swelling markedly decreased NEUROLOGIC: Cranial nerves II through XII are intact. Muscle strength 5/5 in all extremities. Sensation intact. Gait not checked.  PSYCHIATRIC: The patient is alert and oriented x 3.  SKIN: No obvious rash, lesion, or ulcer.   DATA REVIEW:   CBC  Recent Labs Lab 03/24/15 0344  WBC 7.7  HGB 9.9*  HCT 29.0*  PLT 231    Chemistries   Recent Labs Lab 03/24/15 0344  NA 136  K 3.1*  CL 102   CO2 26  GLUCOSE 100*  BUN 13  CREATININE 0.90  CALCIUM 8.3*    Cardiac Enzymes No results for input(s): TROPONINI in the last 168 hours.  Microbiology Results  Results for orders placed or performed during the hospital encounter of 03/23/15  CULTURE, BLOOD (ROUTINE X 2) w Reflex to PCR ID Panel     Status: None (Preliminary result)   Collection Time: 03/23/15  8:54 PM  Result Value Ref Range Status   Specimen Description BLOOD RIGHT ASSIST CONTROL  Final   Special Requests BOTTLES DRAWN AEROBIC AND ANAEROBIC 5CC  Final   Culture  Setup Time   Final    GRAM POSITIVE COCCI AEROBIC BOTTLE ONLY CRITICAL RESULT CALLED TO, READ BACK BY AND VERIFIED WITH: CRYSTAL [place] 03/25/15 0910 MLM    Culture   Final    COAGULASE NEGATIVE STAPHYLOCOCCUS AEROBIC BOTTLE ONLY Results consistent with contamination.    Report Status PENDING  Incomplete  Blood Culture ID Panel (Reflexed)     Status: Abnormal  Collection Time: 03/23/15  8:54 PM  Result Value Ref Range Status   Enterococcus species NOT DETECTED NOT DETECTED Final   Vancomycin resistance NOT DETECTED NOT DETECTED Final   Listeria monocytogenes NOT DETECTED NOT DETECTED Final   Staphylococcus species DETECTED (A) NOT DETECTED Final    Comment: CRITICAL RESULT CALLED TO, READ BACK BY AND VERIFIED WITH: CRYSTAL [place] 03/25/15 0910 MLM    Staphylococcus aureus NOT DETECTED NOT DETECTED Final   Methicillin resistance NOT DETECTED NOT DETECTED Final   Streptococcus species NOT DETECTED NOT DETECTED Final   Streptococcus agalactiae NOT DETECTED NOT DETECTED Final   Streptococcus pneumoniae NOT DETECTED NOT DETECTED Final   Streptococcus pyogenes NOT DETECTED NOT DETECTED Final   Acinetobacter baumannii NOT DETECTED NOT DETECTED Final   Enterobacteriaceae species NOT DETECTED NOT DETECTED Final   Enterobacter cloacae complex NOT DETECTED NOT DETECTED Final   Escherichia coli NOT DETECTED NOT DETECTED Final   Klebsiella oxytoca NOT  DETECTED NOT DETECTED Final   Klebsiella pneumoniae NOT DETECTED NOT DETECTED Final   Proteus species NOT DETECTED NOT DETECTED Final   Serratia marcescens NOT DETECTED NOT DETECTED Final   Carbapenem resistance NOT DETECTED NOT DETECTED Final   Haemophilus influenzae NOT DETECTED NOT DETECTED Final   Neisseria meningitidis NOT DETECTED NOT DETECTED Final   Pseudomonas aeruginosa NOT DETECTED NOT DETECTED Final   Candida albicans NOT DETECTED NOT DETECTED Final   Candida glabrata NOT DETECTED NOT DETECTED Final   Candida krusei NOT DETECTED NOT DETECTED Final   Candida parapsilosis NOT DETECTED NOT DETECTED Final   Candida tropicalis NOT DETECTED NOT DETECTED Final  Wound culture     Status: None (Preliminary result)   Collection Time: 03/24/15  2:21 AM  Result Value Ref Range Status   Specimen Description SKIN  Final   Special Requests NONE  Final   Gram Stain   Final    MODERATE RED BLOOD CELLS MANY WBC SEEN RARE GRAM POSITIVE COCCI IN PAIRS IN CLUSTERS    Culture   Final    MODERATE GROWTH STAPHYLOCOCCUS AUREUS SUSCEPTIBILITIES TO FOLLOW    Report Status PENDING  Incomplete  Wound culture     Status: None (Preliminary result)   Collection Time: 03/24/15  2:21 AM  Result Value Ref Range Status   Specimen Description SKIN  Final   Special Requests NONE  Final   Gram Stain   Final    FEW RED BLOOD CELLS RARE WBC SEEN RARE GRAM POSITIVE COCCI    Culture HEAVY GROWTH STAPHYLOCOCCUS AUREUS  Final   Report Status PENDING  Incomplete  CULTURE, BLOOD (ROUTINE X 2) w Reflex to PCR ID Panel     Status: None (Preliminary result)   Collection Time: 03/24/15  9:42 AM  Result Value Ref Range Status   Specimen Description BLOOD LEFT ARM  Final   Special Requests BOTTLES DRAWN AEROBIC AND ANAEROBIC 5CC  Final   Culture NO GROWTH 1 DAY  Final   Report Status PENDING  Incomplete    RADIOLOGY:  No results found.   Management plans discussed with the patient, family and they are in  agreement.  CODE STATUS:     Code Status Orders        Start     Ordered   03/24/15 0337  Full code   Continuous     03/24/15 0336    Code Status History    Date Active Date Inactive Code Status Order ID Comments User Context   This patient  has a current code status but no historical code status.      TOTAL TIME TAKING CARE OF THIS PATIENT: 28 minutes.    Katrena Stehlin,  Karenann Cai.D on 03/26/2015 at 9:16 AM  Between 7am to 6pm - Pager - [phone]  After 6pm go to [email or web address] - password [email or web address] [facility]  Office  [phone]  CC: Primary care physician; Halina Maidens, MD

## 2015-03-26 NOTE — Progress Notes (Signed)
Discharge instructions, prescriptions, and wound care given to daughter with verbalized understanding.

## 2015-03-26 NOTE — Care Management Note (Signed)
Case Management Note  Patient Details  Name: Christine [name] MRN: [id number] Date of Birth: September 24, 1932  Subjective/Objective:      Home health already arranged through [place] and Retia Passe from [place] was updated about discharge home today. [facility] has delivered the rolling walker ordered by Dr Lavetta Nielsen. Apolonio Schneiders RN was updated that [name] will be leaving this morning to return home with her daughter who is also being discharged home from [facility] this morning. A cousin will pick them both up at the same time for transport home.            Action/Plan:   Expected Discharge Date:                  Expected Discharge Plan:     In-House Referral:     Discharge planning Services     Post Acute Care Choice:    Choice offered to:     DME Arranged:    DME Agency:     HH Arranged:    [place] Agency:     Status of Service:     Medicare Important Message Given:    Date Medicare IM Given:    Medicare IM give by:    Date Additional Medicare IM Given:    Additional Medicare Important Message give by:     If discussed at [place] of Stay Meetings, dates discussed:    Additional Comments:  Anastassia Noack A, RN 03/26/2015, 10:46 AM

## 2015-03-26 NOTE — Care Management Note (Signed)
Case Management Note  Patient Details  Name: JANILA SYLLA MRN: [id number] Date of Birth: December 28, 1932  Subjective/Objective:   Requested delivery of a rolling walker to room 146 to Will Ou[facility] S[facility] at [employer].                  Action/Plan:   Expected Discharge Date:                  Expected Discharge Plan:     In-House Referral:     Discharge planning Services     Post Acute Care Choice:    Choice offered to:     DME Arranged:    DME Agency:     HH Arranged:    [place] Agency:     Status of Service:     Medicare Important Message Given:    Date Medicare IM Given:    Medicare IM give by:    Date Additional Medicare IM Given:    Additional Medicare Important Message give by:     If discussed at [place] of Stay Meetings, dates discussed:    Additional Comments:  [name] Silberman A, RN 03/26/2015, 9:33 AM

## 2015-03-27 LAB — WOUND CULTURE

## 2015-03-28 LAB — CULTURE, BLOOD (ROUTINE X 2)

## 2015-03-29 LAB — CULTURE, BLOOD (ROUTINE X 2): CULTURE: NO GROWTH

## 2015-04-03 ENCOUNTER — Other Ambulatory Visit
Admission: RE | Admit: 2015-04-03 | Discharge: 2015-04-03 | Disposition: A | Discharge status: Home / Self Care | Payer: Medicare Other | Source: Ambulatory Visit | Attending: Internal Medicine | Admitting: Internal Medicine

## 2015-04-03 ENCOUNTER — Encounter: Payer: Medicare Other | Attending: Internal Medicine | Admitting: Internal Medicine

## 2015-04-03 DIAGNOSIS — M1990 Unspecified osteoarthritis, unspecified site: Secondary | ICD-10-CM | POA: Insufficient documentation

## 2015-04-03 DIAGNOSIS — Z882 Allergy status to sulfonamides status: Secondary | ICD-10-CM | POA: Insufficient documentation

## 2015-04-03 DIAGNOSIS — L089 Local infection of the skin and subcutaneous tissue, unspecified: Secondary | ICD-10-CM | POA: Insufficient documentation

## 2015-04-03 DIAGNOSIS — X58XXXA Exposure to other specified factors, initial encounter: Secondary | ICD-10-CM | POA: Insufficient documentation

## 2015-04-03 DIAGNOSIS — I509 Heart failure, unspecified: Secondary | ICD-10-CM | POA: Insufficient documentation

## 2015-04-03 DIAGNOSIS — I110 Hypertensive heart disease with heart failure: Secondary | ICD-10-CM | POA: Insufficient documentation

## 2015-04-03 DIAGNOSIS — S61300A Unspecified open wound of right index finger with damage to nail, initial encounter: Secondary | ICD-10-CM | POA: Insufficient documentation

## 2015-04-03 DIAGNOSIS — Z880 Allergy status to penicillin: Secondary | ICD-10-CM | POA: Diagnosis not present

## 2015-04-03 DIAGNOSIS — L03114 Cellulitis of left upper limb: Secondary | ICD-10-CM | POA: Diagnosis not present

## 2015-04-04 NOTE — Progress Notes (Signed)
Christine [name], Christine [name] ([id number]) Visit Report for 04/03/2015 Abuse/Suicide Risk Screen Details [name] Date of Service: 04/03/2015 8:45 AM Patient Name: A. Patient Account Number: [id number] Medical Record Treating RN: Baruch Gouty RN, BSN, Velva Harman [id number] Number: Other Clinician: Date of Birth/Sex: 07/23/1932 (82 y.o. Female) Treating ROBSON, MICHAEL Primary Care Physician/Extender: Delorise Royals, John Physician: Referring Physician: Corinne Ports in Treatment: 0 Abuse/Suicide Risk Screen Items Answer ABUSE/SUICIDE RISK SCREEN: Has anyone close to you tried to hurt or harm you recentlyo No Do you feel uncomfortable with anyone in your familyo No Has anyone forced you do things that you didnot want to doo No Do you have any thoughts of harming yourselfo No Patient displays signs or symptoms of abuse and/or neglect. No Electronic Signature(s) Signed: 04/03/2015 8:47:09 AM By: Regan Lemming BSN, RN Entered By: Regan Lemming on 04/03/2015 08:47:09 Christine [name] ([id number]) -------------------------------------------------------------------------------- Activities of Daily Living Details [name] Date of Service: 04/03/2015 8:45 AM Patient Name: A. Patient Account Number: [id number] Medical Record Treating RN: Baruch Gouty RN, BSN, Velva Harman [id number] Number: Other Clinician: Date of Birth/Sex: 1932-09-18 (82 y.o. Female) Treating ROBSON, MICHAEL Primary Care Physician/Extender: [place] Physician: Referring Physician: Corinne Ports in Treatment: 0 Activities of Daily Living Items Answer Activities of Daily Living (Please select one for each item) Drive Automobile Completely Able Take Medications Completely Able Use Telephone Completely Able Care for Appearance Completely Able Use Toilet Completely Able Bath / Shower Completely Able Dress Self Completely Able Feed Self Completely Able Walk Completely Able Get In / Out Bed Completely  Able Housework Completely Able Prepare Meals Completely Able Handle Money Completely Able Shop for Self Completely Able Electronic Signature(s) Signed: 04/03/2015 8:47:37 AM By: Regan Lemming BSN, RN Entered By: Regan Lemming on 04/03/2015 08:47:37 Christine [name] ([id number]) -------------------------------------------------------------------------------- Education Assessment Details [name] Date of Service: 04/03/2015 8:45 AM Patient Name: A. Patient Account Number: [id number] Medical Record Treating RN: Baruch Gouty RN, BSN, Velva Harman [id number] Number: Other Clinician: Date of Birth/Sex: [birth year] (82 y.o. Female) Treating ROBSON, MICHAEL Primary Care Physician/Extender: Delorise Royals, John Physician: Referring Physician: Corinne Ports in Treatment: 0 Primary Learner Assessed: Patient Learning Preferences/Education Level/Primary Language Learning Preference: Explanation Highest Education Level: High School Preferred Language: Spanish Cognitive Barrier Assessment/Beliefs Language Barrier: No Physical Barrier Assessment Impaired Vision: No Impaired Hearing: No Knowledge/Comprehension Assessment Knowledge Level: High Comprehension Level: High Ability to understand written High instructions: Ability to understand verbal High instructions: Motivation Assessment Anxiety Level: Calm Cooperation: Cooperative Education Importance: Acknowledges Need Interest in Health Problems: Asks Questions Perception: Coherent Willingness to Engage in Self- High Management Activities: Readiness to Engage in Self- High Management Activities: Electronic Signature(s) Signed: 04/03/2015 8:48:00 AM By: Regan Lemming BSN, RN Entered By: Regan Lemming on 04/03/2015 08:48:00 Christine [name], Christine [name] ([id number]) Christine [name], Christine [name] ([id number]) -------------------------------------------------------------------------------- Fall Risk Assessment Details [name] Date of  Service: 04/03/2015 8:45 AM Patient Name: A. Patient Account Number: [id number] Medical Record Treating RN: Baruch Gouty RN, BSN, Velva Harman [id number] Number: Other Clinician: Date of Birth/Sex: 02/25/1932 (82 y.o. Female) Treating ROBSON, MICHAEL Primary Care Physician/Extender: Delorise Royals, John Physician: Referring Physician: Corinne Ports in Treatment: 0 Fall Risk Assessment Items Have you had 2 or more falls in the last 12 monthso 0 No Have you had any fall that resulted in injury in the last 12 monthso 0 No FALL RISK ASSESSMENT: History of falling - immediate or within 3 months 0 No Secondary diagnosis 0 No Ambulatory aid None/bed rest/wheelchair/nurse 0 Yes Crutches/cane/walker 0 No  Furniture 0 No IV Access/Saline Lock 0 No Gait/Training Normal/bed rest/immobile 0 Yes Weak 0 No Impaired 0 No Mental Status Oriented to own ability 0 Yes Electronic Signature(s) Signed: 04/03/2015 8:48:10 AM By: Regan Lemming BSN, RN Entered By: Regan Lemming on 04/03/2015 08:48:09 Christine [name] ([id number]) -------------------------------------------------------------------------------- Foot Assessment Details [name] Date of Service: 04/03/2015 8:45 AM Patient Name: A. Patient Account Number: [id number] Medical Record Treating RN: Baruch Gouty RN, BSN, Velva Harman [id number] Number: Other Clinician: Date of Birth/Sex: 1932/04/23 (82 y.o. Female) Treating ROBSON, MICHAEL Primary Care Physician/Extender: Delorise Royals, John Physician: Referring Physician: Corinne Ports in Treatment: 0 Foot Assessment Items Site Locations + = Sensation present, - = Sensation absent, C = Callus, U = Ulcer R = Redness, W = Warmth, M = Maceration, PU = Pre-ulcerative lesion F = Fissure, S = Swelling, D = Dryness Assessment Right: Left: Other Deformity: No No Prior Foot Ulcer: No No Prior Amputation: No No Charcot Joint: No No Ambulatory Status: Ambulatory Without Help Gait: Steady Electronic  Signature(s) Signed: 04/03/2015 8:48:27 AM By: Regan Lemming BSN, RN Entered By: Regan Lemming on 04/03/2015 08:48:27 Christine [name], Christine [name] ([id number]) Fakhouri, Meeah AMarland Kitchen ([id number]) -------------------------------------------------------------------------------- Nutrition Risk Assessment Details [name] Date of Service: 04/03/2015 8:45 AM Patient Name: A. Patient Account Number: [id number] Medical Record Treating RN: Baruch Gouty RN, BSN, Velva Harman [id number] Number: Other Clinician: Date of Birth/Sex: [birth year] (82 y.o. Female) Treating Dellia Nims, MICHAEL Primary Care Physician/Extender: Delorise Royals, John Physician: Referring Physician: Corinne Ports in Treatment: 0 Height (in): Weight (lbs): Body Mass Index (BMI): Nutrition Risk Assessment Items NUTRITION RISK SCREEN: I have an illness or condition that made me change the kind and/or 0 No amount of food I eat I eat fewer than two meals per day 0 No I eat few fruits and vegetables, or milk products 0 No I have three or more drinks of beer, liquor or wine almost every day 0 No I have tooth or mouth problems that make it hard for me to eat 0 No I don't always have enough money to buy the food I need 0 No I eat alone most of the time 0 No I take three or more different prescribed or over-the-counter drugs a 0 No day Without wanting to, I have lost or gained 10 pounds in the last six 0 No months I am not always physically able to shop, cook and/or feed myself 0 No Nutrition Protocols Good Risk Protocol 0 No interventions needed Moderate Risk Protocol Electronic Signature(s) Signed: 04/03/2015 8:48:18 AM By: Regan Lemming BSN, RN Entered By: Regan Lemming on 04/03/2015 08:48:18

## 2015-04-06 LAB — WOUND CULTURE: Culture: NO GROWTH

## 2015-04-06 NOTE — Progress Notes (Signed)
Christine [name], Christine [name] ([id number]) Visit Report for 04/03/2015 Chief Complaint Document Details Christine [name] Date of Service: 04/03/2015 8:45 AM Patient Name: A. Patient Account Number: [id number] Medical Record Treating RN: Baruch Gouty RN, BSN, Velva Harman [id number] Number: Other Clinician: Date of Birth/Sex: 1932-11-03 (82 y.o. Female) Treating Lexx Monte Primary Care Physician/Extender: Delorise Royals, John Physician: Referring Physician: Corinne Ports in Treatment: 0 Information Obtained from: Patient Chief Complaint Patient arrives for review of a necrotic wound on the dorsal aspect of her left in the index finger Electronic Signature(s) Signed: 04/04/2015 4:33:06 PM By: Linton Ham MD Entered By: Linton Ham on 04/03/2015 12:55:33 Christine [name], Christine A. ([id number]) -------------------------------------------------------------------------------- Debridement Details Christine [name] Date of Service: 04/03/2015 8:45 AM Patient Name: A. Patient Account Number: [id number] Medical Record Treating RN: Baruch Gouty RN, BSN, Velva Harman [id number] Number: Other Clinician: Date of Birth/Sex: [birth year] (82 y.o. Female) Treating Burgess Sheriff Primary Care Physician/Extender: Delorise Royals, John Physician: Referring Physician: Corinne Ports in Treatment: 0 Debridement Performed for Wound #1 Left,Dorsal Hand - 1st Digit Assessment: Performed By: Physician Ricard Dillon, MD Debridement: Debridement Pre-procedure Yes Verification/Time Out Taken: Start Time: 09:20 Pain Control: Lidocaine 4% Topical Solution Level: Skin/Subcutaneous Tissue Total Area Debrided (L x 3 (cm) x 2 (cm) = 6 (cm) W): Tissue and other Exudate, Fibrin/Slough, Skin, Subcutaneous material debrided: Instrument: Curette, Scissors Bleeding: Minimum Hemostasis Achieved: Pressure End Time: 09:25 Procedural Pain: 0 Post Procedural Pain: 0 Response to Treatment: Procedure was tolerated well Post  Debridement Measurements of Total Wound Length: (cm) 3 Width: (cm) 2 Depth: (cm) 0.2 Volume: (cm) 0.942 Post Procedure Diagnosis Same as Pre-procedure Electronic Signature(s) Signed: 04/03/2015 5:19:34 PM By: Regan Lemming BSN, RN Signed: 04/04/2015 4:33:06 PM By: Linton Ham MD Entered By: Linton Ham on 04/03/2015 12:54:57 Christine [name], Christine [name] [name] ([id number]) Christine [name], Christine A. ([id number]) -------------------------------------------------------------------------------- HPI Details Christine [name] Date of Service: 04/03/2015 8:45 AM Patient Name: A. Patient Account Number: [id number] Medical Record Treating RN: Baruch Gouty RN, BSN, Velva Harman [id number] Number: Other Clinician: Date of Birth/Sex: Jul 08, 1932 (82 y.o. Female) Treating Dellia Nims, Sueann Brownley Primary Care Physician/Extender: Delorise Royals, John Physician: Referring Physician: Corinne Ports in Treatment: 0 History of Present Illness HPI Description: 04/03/15 [name] is a pleasant independent 82 year old woman who arrives today accompanied by her daughter. She tells me that roughly 2 weeks ago she noted swelling of the distal part of her left index finger. She denies any specific injury but in particular she denies any form of bite injury either herself or pad so. She had no trauma to the finger that she knows of. In any case the swelling became worse and the pain became more severe. Initially she does from her daughter but eventually showed it to her. She ended up being admitted to [facility] on 3/11 she had a felon of her finger along with cellulitis. The infected area was decompressed and her nail bed was removed due to infection. The wound was irrigated and saline dressing was applied.. The culture of this grew methicillin sensitive staph aureus. She was ultimately discharged on Keflex which she completed one to 2 days ago. She saw her primary physician at the current normal clinic on 3/15.  He noted prior granulation tissue. Use silver nitrate and use Xeroform. Electronic Signature(s) Signed: 04/04/2015 4:33:06 PM By: Linton Ham MD Entered By: Linton Ham on 04/03/2015 12:58:38 Christine [name] ([id number]) -------------------------------------------------------------------------------- Physical Exam Details Christine [name] Date of Service: 04/03/2015 8:45 AM Patient Name: A. Patient Account Number: [id number] Medical Record Treating  RN: Baruch Gouty RN, BSN, Velva Harman [id number] Number: Other Clinician: Date of Birth/Sex: Nov 12, 1932 (82 y.o. Female) Treating Dellia Nims, Omer Monter Primary Care Physician/Extender: Rudyard Physician: Referring Physician: Corinne Ports in Treatment: 0 Constitutional Pulse regular and within target range for patient.. Temperature is normal and within the target range for the patient.. Patient's appearance is neat and clean. Appears in no acute distress. Well nourished and well developed.. Neck Neck supple and symmetrical. No masses or crepitus. Respiratory Respiratory effort is easy and symmetric bilaterally. Rate is normal at rest and on room air.. Bilateral breath sounds are clear and equal in all lobes with no wheezes, rales or rhonchi.. Cardiovascular Heart rhythm and rate regular, without murmur or gallop.. Radial pulses are intact. Lymphatic None palpable in the epitrochlear or axillary areas. Notes Wound exam; the area is over the distal dorsal aspect of her left index finger. She has necrotic tissue at the base of what was her nail bed. There are 2 areas the probe to bone and I am not convinced that this doesn't involve the DIP joint itself for. With palpation I was able to express some fluid coming out from what I think might be the DIP joint I cultured this. The skin on the finger is dark there is no involvement of the PIP and MCP joint that on that digit. Electronic Signature(s) Signed: 04/04/2015 4:33:06 PM  By: Linton Ham MD Entered By: Linton Ham on 04/03/2015 13:06:32 Christine [name] ([id number]) -------------------------------------------------------------------------------- Physician Orders Details Christine [name] Date of Service: 04/03/2015 8:45 AM Patient Name: A. Patient Account Number: [id number] Medical Record Treating RN: Baruch Gouty RN, BSN, Velva Harman [id number] Number: Other Clinician: Date of Birth/Sex: [birth year] (82 y.o. Female) Treating Melodee Lupe Primary Care Physician/Extender: Delorise Royals, John Physician: Referring Physician: Corinne Ports in Treatment: 0 Verbal / Phone Orders: Yes Clinician: Afful, RN, BSN, Rita Read Back and Verified: Yes Diagnosis Coding Wound Cleansing Wound #1 Left,Dorsal Hand - 1st Digit o Cleanse wound with mild soap and water o May Shower, gently pat wound dry prior to applying new dressing. Anesthetic Wound #1 Left,Dorsal Hand - 1st Digit o Topical Lidocaine 4% cream applied to wound bed prior to debridement Primary Wound Dressing Wound #1 Left,Dorsal Hand - 1st Digit o Aquacel Ag Secondary Dressing Wound #1 Left,Dorsal Hand - 1st Digit o Gauze and Kerlix/Conform Dressing Change Frequency Wound #1 Left,Dorsal Hand - 1st Digit o Change dressing every other day. Follow-up Appointments Wound #1 Left,Dorsal Hand - 1st Digit o Return Appointment in 1 week. Additional Orders / Instructions Wound #1 Left,Dorsal Hand - 1st Digit o Increase protein intake. o Activity as tolerated Medications-please add to medication list. Wound #1 Left,Dorsal Hand - 1st Digit Kaseman, Shanvi A. ([id number]) o P.O. Antibiotics - Doxy 100 mg BID x 7 days Consults o General Surgery - Hand surgery Laboratory o Bacteria identified in Wound by Culture (MICRO) - left index finger oooo LOINC Code: [id number] oooo Convenience Name: Wound culture routine Electronic Signature(s) Signed: 04/03/2015 5:19:34 PM By:  Regan Lemming BSN, RN Signed: 04/04/2015 4:33:06 PM By: Linton Ham MD Entered By: Regan Lemming on 04/03/2015 09:43:39 Christine [name], Christine A. ([id number]) -------------------------------------------------------------------------------- Prescription 04/03/2015 Patient Name: Christine [name] A. Physician: Ricard Dillon MD Date of Birth: [birth year] NPI#: [id number] Sex: F DEA#: [id number] Phone #: [phone] License #: [id number] Patient Address: [place] [address] [place], [place] [address] [facility] [address] [place], [place] [address] [phone] Allergies ACE Inhibitors  Cardizem Dyazide Norvasc Penicillins Sulfa (Sulfonamide Antibiotics) Physician's Orders P.O. Antibiotics - Doxy 100 mg BID x 7 days Signature(s): Date(s): Electronic Signature(s) MCKAYLEE, MANDELLA ([id number]) Signed: 04/04/2015 4:33:06 PM By: Linton Ham MD Entered By: Linton Ham on 04/03/2015 13:10:13 Szczerba, Kerrie Buffalo ([id number]) --------------------------------------------------------------------------------  Problem List Details Christine [name] Date of Service: 04/03/2015 8:45 AM Patient Name: A. Patient Account Number: [id number] Medical Record Treating RN: Baruch Gouty RN, BSN, Velva Harman [id number] Number: Other Clinician: Date of Birth/Sex: 06/02/1932 (82 y.o. Female) Treating Aradia Estey Primary Care Physician/Extender: Delorise Royals, John Physician: Referring Physician: Corinne Ports in Treatment: 0 Active Problems ICD-10 Encounter Code Description Active Date Diagnosis S61.300A Unspecified open wound of right index finger with damage 04/03/2015 Yes to nail, initial encounter L03.114 Cellulitis of left upper limb 04/03/2015 Yes Inactive Problems Resolved Problems Electronic Signature(s) Signed: 04/04/2015 4:33:06 PM By: Linton Ham MD Entered By: Linton Ham on 04/03/2015  12:54:43 Christine [name], Christine [name] [name] ([id number]) -------------------------------------------------------------------------------- Progress Note Details Christine [name] Date of Service: 04/03/2015 8:45 AM Patient Name: A. Patient Account Number: [id number] Medical Record Treating RN: Baruch Gouty RN, BSN, Velva Harman [id number] Number: Other Clinician: Date of Birth/Sex: 05-Dec-1932 (82 y.o. Female) Treating Mishika Flippen Primary Care Physician/Extender: Delorise Royals, John Physician: Referring Physician: Corinne Ports in Treatment: 0 Subjective Chief Complaint Information obtained from Patient Patient arrives for review of a necrotic wound on the dorsal aspect of her left in the index finger History of Present Illness (HPI) 04/03/15 Mrs. Wimpy is a pleasant independent 82 year old woman who arrives today accompanied by her daughter. She tells me that roughly 2 weeks ago she noted swelling of the distal part of her left index finger. She denies any specific injury but in particular she denies any form of bite injury either herself or pad so. She had no trauma to the finger that she knows of. In any case the swelling became worse and the pain became more severe. Initially she does from her daughter but eventually showed it to her. She ended up being admitted to [facility] on 3/11 she had a felon of her finger along with cellulitis. The infected area was decompressed and her nail bed was removed due to infection. The wound was irrigated and saline dressing was applied.. The culture of this grew methicillin sensitive staph aureus. She was ultimately discharged on Keflex which she completed one to 2 days ago. She saw her primary physician at the current normal clinic on 3/15. He noted prior granulation tissue. Use silver nitrate and use Xeroform. Wound History Patient presents with 1 open wound that has been present for approximately 2weeks. Patient has been treating wound  in the following manner: xeroform. Laboratory tests have not been performed in the last month. Patient reportedly has not tested positive for an antibiotic resistant organism. Patient reportedly has not tested positive for osteomyelitis. Patient reportedly has not had testing performed to evaluate circulation in the legs. Patient experiences the following problems associated with their wounds: infection. Patient History Information obtained from Patient, Caregiver, Chart. Allergies ACE Inhibitors, Cardizem, Dyazide, Norvasc, Penicillins, Sulfa (Sulfonamide Antibiotics) Family History Diabetes - Siblings, Hypertension - Mother, Stroke - Father, Father, No family history of Cancer, Heart Disease, Kidney Disease, Lung Disease, Seizures, Thyroid Problems, Christine [name], Christine A. ([id number]) Tuberculosis. Social History Never smoker, Marital Status - Widowed, Alcohol Use - Never, Drug Use - No History, Caffeine Use - Moderate. Medical History Eyes Patient has history of Cataracts Ear/Nose/Mouth/Throat Denies history of Chronic sinus problems/congestion, Middle ear problems  Hematologic/Lymphatic Patient has history of Anemia Respiratory Denies history of Aspiration, Asthma, Chronic Obstructive Pulmonary Disease (COPD), Pneumothorax, Sleep Apnea, Tuberculosis Cardiovascular Patient has history of Arrhythmia, Congestive Heart Failure, Hypertension Gastrointestinal Denies history of Cirrhosis , Colitis, Crohn s, Hepatitis A, Hepatitis B, Hepatitis C Endocrine Denies history of Type I Diabetes, Type II Diabetes Genitourinary Denies history of End Stage Renal Disease Immunological Denies history of Lupus Erythematosus, Raynaud s, Scleroderma Integumentary (Skin) Denies history of History of Burn, History of pressure wounds Musculoskeletal Patient has history of Osteoarthritis Denies history of Gout Neurologic Denies history of Dementia, Neuropathy, Quadriplegia, Paraplegia, Seizure  Disorder Oncologic Denies history of Received Chemotherapy, Received Radiation Psychiatric Denies history of Anorexia/bulimia, Confinement Anxiety Review of Systems (ROS) Constitutional Symptoms (General Health) The patient has no complaints or symptoms. Eyes Complains or has symptoms of Glasses / Contacts. Ear/Nose/Mouth/Throat The patient has no complaints or symptoms. Hematologic/Lymphatic The patient has no complaints or symptoms. Respiratory The patient has no complaints or symptoms. Cardiovascular The patient has no complaints or symptoms. Christine [name], Christine A. ([id number]) Gastrointestinal The patient has no complaints or symptoms. Endocrine The patient has no complaints or symptoms. Genitourinary The patient has no complaints or symptoms. Immunological The patient has no complaints or symptoms. Integumentary (Skin) Complains or has symptoms of Wounds. Musculoskeletal The patient has no complaints or symptoms. Neurologic The patient has no complaints or symptoms. Oncologic The patient has no complaints or symptoms. Psychiatric The patient has no complaints or symptoms. Objective Constitutional Pulse regular and within target range for patient.. Temperature is normal and within the target range for the patient.. Patient's appearance is neat and clean. Appears in no acute distress. Well nourished and well developed.. Vitals Time Taken: 8:51 AM, Height: 65 in, Source: Stated, Weight: 219 lbs, Source: Stated, BMI: 36.4, Respiratory Rate: 18 breaths/min. Neck Neck supple and symmetrical. No masses or crepitus. Respiratory Respiratory effort is easy and symmetric bilaterally. Rate is normal at rest and on room air.. Bilateral breath sounds are clear and equal in all lobes with no wheezes, rales or rhonchi.. Cardiovascular Heart rhythm and rate regular, without murmur or gallop.. Radial pulses are intact. Lymphatic None palpable in the epitrochlear or axillary  areas. General Notes: Wound exam; the area is over the distal dorsal aspect of her left index finger. She has Helder, Gracelin A. ([id number]) necrotic tissue at the base of what was her nail bed. There are 2 areas the probe to bone and I am not convinced that this doesn't involve the DIP joint itself for. With palpation I was able to express some fluid coming out from what I think might be the DIP joint I cultured this. The skin on the finger is dark there is no involvement of the PIP and MCP joint that on that digit. Integumentary (Hair, Skin) Wound #1 status is Open. Original cause of wound was Gradually Appeared. The wound is located on the Left,Dorsal Hand - 1st Digit. The wound measures 3cm length x 2cm width x 0.1cm depth; 4.712cm^2 area and 0.471cm^3 volume. The wound is limited to skin breakdown. There is no tunneling or undermining noted. There is a medium amount of serosanguineous drainage noted. The wound margin is distinct with the outline attached to the wound base. There is small (1-33%) pink granulation within the wound bed. There is a large (67-100%) amount of necrotic tissue within the wound bed including Adherent Slough. The periwound skin appearance exhibited: Moist. The periwound skin appearance did not exhibit: Callus, Crepitus, Excoriation, Fluctuance, Friable, Induration,  Localized Edema, Rash, Scarring, Dry/Scaly, Maceration, Atrophie Blanche, Cyanosis, Ecchymosis, Hemosiderin Staining, Mottled, Pallor, Rubor, Erythema. Periwound temperature was noted as No Abnormality. The periwound has tenderness on palpation. Assessment Active Problems ICD-10 S61.300A - Unspecified open wound of right index finger with damage to nail, initial encounter L03.114 - Cellulitis of left upper limb Procedures Wound #1 Wound #1 is an Abscess located on the Left,Dorsal Hand - 1st Digit . There was a Skin/Subcutaneous Tissue Debridement [id number]) debridement with total area of 6 sq  cm performed by Ricard Dillon, MD. with the following instrument(s): Curette and Scissors including Exudate, Fibrin/Slough, Skin, and Subcutaneous after achieving pain control using Lidocaine 4% Topical Solution. A time out was conducted prior to the start of the procedure. A Minimum amount of bleeding was controlled with Pressure. The procedure was tolerated well with a pain level of 0 throughout and a pain level of 0 following the procedure. Post Debridement Measurements: 3cm length x 2cm width x 0.2cm depth; 0.942cm^3 volume. Post procedure Diagnosis Wound #1: Same as Pre-Procedure Christine [name], Christine A. ([id number]) Plan Wound Cleansing: Wound #1 Left,Dorsal Hand - 1st Digit: Cleanse wound with mild soap and water May Shower, gently pat wound dry prior to applying new dressing. Anesthetic: Wound #1 Left,Dorsal Hand - 1st Digit: Topical Lidocaine 4% cream applied to wound bed prior to debridement Primary Wound Dressing: Wound #1 Left,Dorsal Hand - 1st Digit: Aquacel Ag Secondary Dressing: Wound #1 Left,Dorsal Hand - 1st Digit: Gauze and Kerlix/Conform Dressing Change Frequency: Wound #1 Left,Dorsal Hand - 1st Digit: Change dressing every other day. Follow-up Appointments: Wound #1 Left,Dorsal Hand - 1st Digit: Return Appointment in 1 week. Additional Orders / Instructions: Wound #1 Left,Dorsal Hand - 1st Digit: Increase protein intake. Activity as tolerated Medications-please add to medication list.: Wound #1 Left,Dorsal Hand - 1st Digit: P.O. Antibiotics - Doxy 100 mg BID x 7 days Laboratory ordered were: Wound culture routine - left index finger Consults ordered were: General Surgery - Hand surgery 1 cellulitis of the fourth finger of the left hand as described. I am concerned that there may be involvement of the DIP joint. 2 culture done of what appeared to be purulent drainage 3 after some consternation, I put her on doxycycline instead of another round of higher  dose Keflex 4 I think she needs to see a hand surgeon attempted to arrange this. 5 Aquacel A G to the wound itself after debridement today. This will need to be changed daily Electronic Signature(s) Christine [name], Christine [name] ([id number]) Signed: 04/04/2015 4:33:06 PM By: Linton Ham MD Entered By: Linton Ham on 04/03/2015 13:09:24 Christine [name] ([id number]) -------------------------------------------------------------------------------- ROS/PFSH Details Christine [name] Date of Service: 04/03/2015 8:45 AM Patient Name: A. Patient Account Number: [id number] Medical Record Treating RN: Baruch Gouty RN, BSN, Velva Harman [id number] Number: Other Clinician: Date of Birth/Sex: [birth year] (82 y.o. Female) Treating Yizel Canby Primary Care Physician/Extender: Delorise Royals, John Physician: Referring Physician: Corinne Ports in Treatment: 0 Information Obtained From Patient Caregiver Chart Wound History Do you currently have one or more open woundso Yes How many open wounds do you currently haveo 1 Approximately how long have you had your woundso 2weeks How have you been treating your wound(s) until nowo xeroform Has your wound(s) ever healed and then re-openedo No Have you had any lab work done in the past montho No Have you tested positive for an antibiotic resistant organism (MRSA, VRE)o No Have you tested positive for osteomyelitis (bone infection)o No Have you had any tests for circulation  on your legso No Have you had other problems associated with your woundso Infection Eyes Complaints and Symptoms: Positive for: Glasses / Contacts Medical History: Positive for: Cataracts Integumentary (Skin) Complaints and Symptoms: Positive for: Wounds Medical History: Negative for: History of Burn; History of pressure wounds Constitutional Symptoms (General Health) Complaints and Symptoms: No Complaints or Symptoms Ear/Nose/Mouth/Throat Complaints and Symptoms: No Complaints  or Symptoms Nordby, Quenisha A. ([id number]) Medical History: Negative for: Chronic sinus problems/congestion; Middle ear problems Hematologic/Lymphatic Complaints and Symptoms: No Complaints or Symptoms Medical History: Positive for: Anemia Respiratory Complaints and Symptoms: No Complaints or Symptoms Medical History: Negative for: Aspiration; Asthma; Chronic Obstructive Pulmonary Disease (COPD); Pneumothorax; Sleep Apnea; Tuberculosis Cardiovascular Complaints and Symptoms: No Complaints or Symptoms Medical History: Positive for: Arrhythmia; Congestive Heart Failure; Hypertension Gastrointestinal Complaints and Symptoms: No Complaints or Symptoms Medical History: Negative for: Cirrhosis ; Colitis; Crohnos; Hepatitis A; Hepatitis B; Hepatitis C Endocrine Complaints and Symptoms: No Complaints or Symptoms Medical History: Negative for: Type I Diabetes; Type II Diabetes Genitourinary Complaints and Symptoms: No Complaints or Symptoms Medical HistoryHADLIE, PAVICH A. ([id number]) Negative for: End Stage Renal Disease Immunological Complaints and Symptoms: No Complaints or Symptoms Medical History: Negative for: Lupus Erythematosus; Raynaudos; Scleroderma Musculoskeletal Complaints and Symptoms: No Complaints or Symptoms Medical History: Positive for: Osteoarthritis Negative for: Gout Neurologic Complaints and Symptoms: No Complaints or Symptoms Medical History: Negative for: Dementia; Neuropathy; Quadriplegia; Paraplegia; Seizure Disorder Oncologic Complaints and Symptoms: No Complaints or Symptoms Medical History: Negative for: Received Chemotherapy; Received Radiation Psychiatric Complaints and Symptoms: No Complaints or Symptoms Medical History: Negative for: Anorexia/bulimia; Confinement Anxiety HBO Extended History Items Eyes: Cataracts Family and Social History Cancer: No; Diabetes: Yes - Siblings; Heart Disease: No; Hypertension: Yes -  Mother; Kidney Disease: No; Lung Disease: No; Seizures: No; Stroke: Yes - Father, Father; Thyroid Problems: No; Tuberculosis: No; Never smoker; Marital Status - Widowed; Alcohol Use: Never; Drug Use: No History; Caffeine Use: Giambalvo, Karmela A. ([id number]) Moderate; Financial Concerns: No; Food, Clothing or Shelter Needs: No; Support System Lacking: No; Transportation Concerns: No; Advanced Directives: No; Living Will: No Electronic Signature(s) Signed: 04/03/2015 9:04:58 AM By: Regan Lemming BSN, RN Signed: 04/04/2015 4:33:06 PM By: Linton Ham MD Entered By: Regan Lemming on 04/03/2015 09:04:57 Christine [name] ([id number]) -------------------------------------------------------------------------------- SuperBill Details Christine [name] Date of Service: 04/03/2015 Patient Name: A. Patient Account Number: [id number] Medical Record Treating RN: Baruch Gouty RN, BSN, Velva Harman [id number] Number: Other Clinician: Date of Birth/Sex: March 20, 1932 (82 y.o. Female) Treating Jaskaran Dauzat Primary Care Physician/Extender: Delorise Royals John Physician: Suella Grove in Treatment: 0 Referring Physician: Leanor Kail Diagnosis Coding ICD-10 Codes Code Description S61.300A Unspecified open wound of right index finger with damage to nail, initial encounter L03.114 Cellulitis of left upper limb Facility Procedures CPT4: Description Modifier Quantity Code [id number] - WOUND CARE VISIT-LEV 3 EST PT 1 CPT4: [id number] 11042 - DEB SUBQ TISSUE 20 SQ CM/< 1 ICD-10 Description Diagnosis S61.300A Unspecified open wound of right index finger with damage to nail, initial encounter Physician Procedures CPT4: Description Modifier Quantity Code [id number] WC PHYS LEVEL 3 o NEW PT 1 ICD-10 Description Diagnosis S61.300A Unspecified open wound of right index finger with damage to nail, initial encounter CPT4: [id number] 11042 - WC PHYS SUBQ TISS 20 SQ CM 1 ICD-10 Description Diagnosis S61.300A Unspecified open wound of right  index finger with damage to nail, initial encounter Electronic Signature(s) JAYCELYN, CUSIMANO ([id number]) Signed: 04/04/2015 4:33:06 PM By: Linton Ham MD Entered By: Linton Ham on 04/03/2015 13:10:05

## 2015-04-06 NOTE — Progress Notes (Signed)
ZENAE, WILKENS ([id number]) Visit Report for 04/03/2015 Allergy List Details ADRIALYS, PERROTTO Date of Service: 04/03/2015 8:45 AM Patient Name: [name]. Patient Account Number: [id number] Medical Record Treating RN: Baruch Gouty RN, BSN, Velva Harman [id number] Number: Other Clinician: Date of Birth/Sex: 1932/12/14 (82 y.o. Female) Treating Dellia Nims, MICHAEL Primary Care Physician/Extender: Delorise Royals, John Physician: Referring Physician: Corinne Ports in Treatment: 0 Allergies Active Allergies ACE Inhibitors Cardizem Dyazide Norvasc Penicillins Sulfa (Sulfonamide Antibiotics) Allergy Notes Electronic Signature(s) Signed: 04/03/2015 9:01:28 AM By: Regan Lemming BSN, RN Entered By: Regan Lemming on 04/03/2015 09:01:27 Christine [name] ([id number]) -------------------------------------------------------------------------------- Arrival Information Details Christine [name] Date of Service: 04/03/2015 8:45 AM Patient Name: [name]. Patient Account Number: [id number] Medical Record Treating RN: Baruch Gouty RN, BSN, Velva Harman [id number] Number: Other Clinician: Date of Birth/Sex: January 19, 1932 (82 y.o. Female) Treating ROBSON, MICHAEL Primary Care Physician/Extender: Delorise Royals, John Physician: Referring Physician: Corinne Ports in Treatment: 0 Visit Information Patient Arrived: Ambulatory Arrival Time: 08:49 Accompanied By: dtr Transfer Assistance: None Patient Identification Verified: Yes Secondary Verification Process Yes Completed: Patient Requires Transmission-Based No Precautions: Patient Has Alerts: No Electronic Signature(s) Signed: 04/03/2015 5:19:34 PM By: Regan Lemming BSN, RN Entered By: Regan Lemming on 04/03/2015 08:51:20 Christine [name] ([id number]) -------------------------------------------------------------------------------- Clinic Level of Care Assessment Details Christine [name] Date of Service: 04/03/2015 8:45 AM Patient Name: [name]. Patient Account Number:  [id number] Medical Record Treating RN: Baruch Gouty RN, BSN, Velva Harman [id number] Number: Other Clinician: Date of Birth/Sex: [birth year] (82 y.o. Female) Treating ROBSON, MICHAEL Primary Care Physician/Extender: Delorise Royals, John Physician: Referring Physician: Corinne Ports in Treatment: 0 Clinic Level of Care Assessment Items TOOL 1 Quantity Score []  - Use when EandM and Procedure is performed on INITIAL visit 0 ASSESSMENTS - Nursing Assessment / Reassessment X - General Physical Exam (combine w/ comprehensive assessment (listed just 1 20 below) when performed on new pt. evals) X - Comprehensive Assessment (HX, ROS, Risk Assessments, Wounds Hx, etc.) 1 25 ASSESSMENTS - Wound and Skin Assessment / Reassessment []  - Dermatologic / Skin Assessment (not related to wound area) 0 ASSESSMENTS - Ostomy and/or Continence Assessment and Care []  - Incontinence Assessment and Management 0 []  - Ostomy Care Assessment and Management (repouching, etc.) 0 PROCESS - Coordination of Care X - Simple Patient / Family Education for ongoing care 1 15 []  - Complex (extensive) Patient / Family Education for ongoing care 0 X - Staff obtains Consents, Records, Test Results / Process Orders 1 10 []  - Staff telephones HHA, Nursing Homes / Clarify orders / etc 0 []  - Routine Transfer to another Facility (non-emergent condition) 0 []  - Routine Hospital Admission (non-emergent condition) 0 X - New Admissions / Biomedical engineer / Ordering NPWT, Apligraf, etc. 1 15 []  - Emergency Hospital Admission (emergent condition) 0 PROCESS - Special Needs []  - Pediatric / Minor Patient Management 0 Christine [name], Christine [name]. ([id number]) []  - Isolation Patient Management 0 []  - Hearing / Language / Visual special needs 0 []  - Assessment of Community assistance (transportation, D/C planning, etc.) 0 []  - Additional assistance / Altered mentation 0 []  - Support Surface(s) Assessment (bed, cushion, seat, etc.)  0 INTERVENTIONS - Miscellaneous []  - External ear exam 0 []  - Patient Transfer (multiple staff / Civil Service fast streamer / Similar devices) 0 []  - Simple Staple / Suture removal (25 or less) 0 []  - Complex Staple / Suture removal (26 or more) 0 []  - Hypo/Hyperglycemic Management (do not check if billed separately) 0 []  - Ankle / Brachial Index (ABI) - do  not check if billed separately 0 Has the patient been seen at the hospital within the last three years: Yes Total Score: 85 Level Of Care: New/Established - Level 3 Electronic Signature(s) Signed: 04/03/2015 5:19:34 PM By: Regan Lemming BSN, RN Entered By: Regan Lemming on 04/03/2015 09:33:27 Christine [name] ([id number]) -------------------------------------------------------------------------------- Encounter Discharge Information Details Christine [name] Date of Service: 04/03/2015 8:45 AM Patient Name: [name]. Patient Account Number: [id number] Medical Record Treating RN: Baruch Gouty RN, BSN, Velva Harman [id number] Number: Other Clinician: Date of Birth/Sex: 07-20-1932 (82 y.o. Female) Treating ROBSON, MICHAEL Primary Care Physician/Extender: Delorise Royals, John Physician: Referring Physician: Corinne Ports in Treatment: 0 Encounter Discharge Information Items Discharge Pain Level: 0 Discharge Condition: Stable Ambulatory Status: Ambulatory Discharge Destination: Home Transportation: Private Auto Accompanied By: dtr Schedule Follow-up Appointment: No Medication Reconciliation completed and provided to Patient/Care No Christine [name]: Provided on Clinical Summary of Care: 04/03/2015 Form Type Recipient Paper Patient MF Electronic Signature(s) Signed: 04/03/2015 9:49:33 AM By: Ruthine Dose Entered By: Ruthine Dose on 04/03/2015 09:49:33 Christine [name], Christine [name]. ([id number]) -------------------------------------------------------------------------------- Multi Wound Chart Details Christine [name] Date of Service: 04/03/2015 8:45 AM Patient  Name: [name]. Patient Account Number: [id number] Medical Record Treating RN: Baruch Gouty RN, BSN, Velva Harman [id number] Number: Other Clinician: Date of Birth/Sex: [birth year] (82 y.o. Female) Treating ROBSON, MICHAEL Primary Care Physician/Extender: Delorise Royals, John Physician: Referring Physician: Corinne Ports in Treatment: 0 Vital Signs Height(in): 65 Pulse(bpm): Weight(lbs): 219 Blood Pressure (mmHg): Body Mass Index(BMI): 36 Temperature(F): Respiratory Rate 18 (breaths/min): Photos: [1:No Photos] [N/[name]:N/[name]] Wound Location: [1:Left Hand - 1st Digit - Dorsal] [N/[name]:N/[name]] Wounding Event: [1:Gradually Appeared] [N/[name]:N/[name]] Primary Etiology: [1:Abscess] [N/[name]:N/[name]] Date Acquired: [1:03/20/2015] [N/[name]:N/[name]] Weeks of Treatment: [1:0] [N/[name]:N/[name]] Wound Status: [1:Open] [N/[name]:N/[name]] Measurements L x W x D 3x2x0.1 [N/[name]:N/[name]] (cm) Area (cm) : [1:4.712] [N/[name]:N/[name]] Volume (cm) : [1:0.471] [N/[name]:N/[name]] % Reduction in Area: [1:0.00%] [N/[name]:N/[name]] % Reduction in Volume: 0.00% [N/[name]:N/[name]] Classification: [1:Full Thickness Without Exposed Support Structures] [N/[name]:N/[name]] Exudate Amount: [1:Medium] [N/[name]:N/[name]] Exudate Type: [1:Serosanguineous] [N/[name]:N/[name]] Exudate Color: [1:red, brown] [N/[name]:N/[name]] Wound Margin: [1:Distinct, outline attached] [N/[name]:N/[name]] Granulation Amount: [1:Small (1-33%)] [N/[name]:N/[name]] Granulation Quality: [1:Pink] [N/[name]:N/[name]] Necrotic Amount: [1:Large (67-100%)] [N/[name]:N/[name]] Exposed Structures: [1:Fascia: No Fat: No Tendon: No] [N/[name]:N/[name]] Muscle: No Joint: No Bone: No Limited to Skin Breakdown Epithelialization: None N/[name] N/[name] Periwound Skin Texture: Edema: No N/[name] N/[name] Excoriation: No Induration: No Callus: No Crepitus: No Fluctuance: No Friable: No Rash: No Scarring: No Periwound Skin Moist: Yes N/[name] N/[name] Moisture: Maceration: No Dry/Scaly: No Periwound Skin Color: Atrophie Blanche: No N/[name] N/[name] Cyanosis: No Ecchymosis: No Erythema: No Hemosiderin Staining: No Mottled: No Pallor: No Rubor:  No Temperature: No Abnormality N/[name] N/[name] Tenderness on Yes N/[name] N/[name] Palpation: Wound Preparation: Ulcer Cleansing: N/[name] N/[name] Rinsed/Irrigated with Saline Topical Anesthetic Applied: Other: lidocaine 4% Treatment Notes Electronic Signature(s) Signed: 04/03/2015 9:06:26 AM By: Regan Lemming BSN, RN Entered By: Regan Lemming on 04/03/2015 09:06:26 Christine [name] ([id number]) -------------------------------------------------------------------------------- [place] Details Christine [name] Date of Service: 04/03/2015 8:45 AM Patient Name: [name]. Patient Account Number: [id number] Medical Record Treating RN: Baruch Gouty RN, BSN, Velva Harman [id number] Number: Other Clinician: Date of Birth/Sex: April 21, 1932 (82 y.o. Female) Treating ROBSON, MICHAEL Primary Care Physician/Extender: Delorise Royals, John Physician: Referring Physician: Corinne Ports in Treatment: 0 Active Inactive Orientation to the Wound Care Program Nursing Diagnoses: Knowledge deficit related to the wound healing center program Goals: Patient/caregiver will verbalize understanding of the [place] Program Date Initiated: 04/03/2015 Goal Status: Active Interventions: Provide education on orientation to the wound center Notes:  Wound/Skin Impairment Nursing Diagnoses: Impaired tissue integrity Knowledge deficit related to ulceration/compromised skin integrity Goals: Patient/caregiver will verbalize understanding of skin care regimen Date Initiated: 04/03/2015 Goal Status: Active Ulcer/skin breakdown will have [name] volume reduction of 30% by week 4 Date Initiated: 04/03/2015 Goal Status: Active Ulcer/skin breakdown will have [name] volume reduction of 50% by week 8 Date Initiated: 04/03/2015 Goal Status: Active Ulcer/skin breakdown will have [name] volume reduction of 80% by week 12 Date Initiated: 04/03/2015 Goal Status: Active Christine [name], Christine [name]. ([id number]) Ulcer/skin breakdown will heal within 14  weeks Date Initiated: 04/03/2015 Goal Status: Active Interventions: Assess patient/caregiver ability to obtain necessary supplies Assess patient/caregiver ability to perform ulcer/skin care regimen upon admission and as needed Assess ulceration(s) every visit Provide education on ulcer and skin care Treatment Activities: Patient referred to home care : 04/03/2015 Referred to DME Alexandre Lightsey for dressing supplies : 04/03/2015 Skin care regimen initiated : 04/03/2015 Topical wound management initiated : 04/03/2015 Notes: Electronic Signature(s) Signed: 04/03/2015 9:06:04 AM By: Regan Lemming BSN, RN Entered By: Regan Lemming on 04/03/2015 09:06:04 Christine [name] ([id number]) -------------------------------------------------------------------------------- Pain Assessment Details Christine [name] Date of Service: 04/03/2015 8:45 AM Patient Name: [name]. Patient Account Number: [id number] Medical Record Treating RN: Baruch Gouty RN, BSN, Velva Harman [id number] Number: Other Clinician: Date of Birth/Sex: Apr 21, 1932 (82 y.o. Female) Treating ROBSON, MICHAEL Primary Care Physician/Extender: Delorise Royals, John Physician: Referring Physician: Corinne Ports in Treatment: 0 Active Problems Location of Pain Severity and Description of Pain Patient Has Paino No Site Locations Pain Management and Medication Current Pain Management: Electronic Signature(s) Signed: 04/03/2015 5:19:34 PM By: Regan Lemming BSN, RN Entered By: Regan Lemming on 04/03/2015 08:51:26 Christine [name] ([id number]) -------------------------------------------------------------------------------- Patient/Caregiver Education Details Christine [name] Date of Service: 04/03/2015 8:45 AM Patient Name: [name]. Patient Account Number: [id number] Medical Record Treating RN: Baruch Gouty RN, BSN, Velva Harman [id number] Number: Other Clinician: Date of Birth/Gender: 1932-04-27 (82 y.o. Female) Treating ROBSON, MICHAEL Primary Care Physician/Extender:  Delorise Royals John Physician: Suella [place] in Treatment: 0 Referring Physician: Leanor Kail Education Assessment Education Provided To: Patient Education Topics Provided Basic Hygiene: Methods: Explain/Verbal Responses: State content correctly Infection: Methods: Explain/Verbal Responses: State content correctly Welcome To [facility]: Methods: Explain/Verbal Responses: State content correctly Wound/Skin Impairment: Methods: Explain/Verbal Responses: State content correctly Electronic Signature(s) Signed: 04/03/2015 5:19:34 PM By: Regan Lemming BSN, RN Entered By: Regan Lemming on 04/03/2015 09:35:30 Christine [name], Gabbriella AMarland Kitchen ([id number]) -------------------------------------------------------------------------------- Wound Assessment Details Christine [name] Date of Service: 04/03/2015 8:45 AM Patient Name: [name]. Patient Account Number: [id number] Medical Record Treating RN: Baruch Gouty RN, BSN, Velva Harman [id number] Number: Other Clinician: Date of Birth/Sex: November 08, 1932 (82 y.o. Female) Treating ROBSON, MICHAEL Primary Care Physician/Extender: Delorise Royals, John Physician: Referring Physician: Corinne Ports in Treatment: 0 Wound Status Wound Number: 1 Primary Etiology: Abscess Wound Location: Left Hand - 1st Digit - Dorsal Wound Status: Open Wounding Event: Gradually Appeared Date Acquired: 03/20/2015 Weeks Of Treatment: 0 Clustered Wound: No Photos Photo Uploaded By: Regan Lemming on 04/03/2015 17:15:40 Wound Measurements Length: (cm) 3 Width: (cm) 2 Depth: (cm) 0.1 Area: (cm) 4.712 Volume: (cm) 0.471 % Reduction in Area: 0% % Reduction in Volume: 0% Epithelialization: None Tunneling: No Undermining: No Wound Description Full Thickness Without Exposed Classification: Support Structures Wound Margin: Distinct, outline attached Exudate Medium Amount: Exudate Type: Serosanguineous Exudate Color: red, brown Foul Odor After Cleansing: No Wound  Bed Christine [name], Christine [name]. ([id number]) Granulation Amount: Small (1-33%) Exposed Structure Granulation Quality: Pink Fascia Exposed: No Necrotic Amount: Large (67-100%) Fat Layer Exposed:  No Necrotic Quality: Adherent Slough Tendon Exposed: No Muscle Exposed: No Joint Exposed: No Bone Exposed: No Limited to Skin Breakdown Periwound Skin Texture Texture Color No Abnormalities Noted: No No Abnormalities Noted: No Callus: No Atrophie Blanche: No Crepitus: No Cyanosis: No Excoriation: No Ecchymosis: No Fluctuance: No Erythema: No Friable: No Hemosiderin Staining: No Induration: No Mottled: No Localized Edema: No Pallor: No Rash: No Rubor: No Scarring: No Temperature / Pain Moisture Temperature: No Abnormality No Abnormalities Noted: No Tenderness on Palpation: Yes Dry / Scaly: No Maceration: No Moist: Yes Wound Preparation Ulcer Cleansing: Rinsed/Irrigated with Saline Topical Anesthetic Applied: Other: lidocaine 4%, Treatment Notes Wound #1 (Left, Dorsal Hand - 1st Digit) 1. Cleansed with: Clean wound with Normal Saline 4. Dressing Applied: Aquacel Ag 5. Secondary Dressing Applied Gauze and Kerlix/Conform 7. Secured with Recruitment consultant) Signed: 04/03/2015 5:19:34 PM By: Regan Lemming BSN, RN Entered By: Regan Lemming on 04/03/2015 08:59:20 Christine [name], Christine [name] ([id number]) Christine [name], Christine AMarland Kitchen ([id number]) -------------------------------------------------------------------------------- Vitals Details Christine [name] Date of Service: 04/03/2015 8:45 AM Patient Name: [name]. Patient Account Number: [id number] Medical Record Treating RN: Baruch Gouty RN, BSN, Velva Harman [id number] Number: Other Clinician: Date of Birth/Sex: 05-18-1932 (82 y.o. Female) Treating ROBSON, MICHAEL Primary Care Physician/Extender: Delorise Royals, John Physician: Referring Physician: Corinne Ports in Treatment: 0 Vital Signs Time Taken: 08:51 Temperature (F): 98.8 Height  (in): 65 Pulse (bpm): 76 Source: Stated Respiratory Rate (breaths/min): 18 Weight (lbs): 219 Blood Pressure (mmHg): 132/76 Source: Stated Reference Range: 80 - 120 mg / dl Body Mass Index (BMI): 36.4 Electronic Signature(s) Signed: 04/03/2015 1:10:31 PM By: Regan Lemming BSN, RN Entered By: Regan Lemming on 04/03/2015 13:10:30

## 2015-04-10 ENCOUNTER — Encounter: Payer: Medicare Other | Admitting: Internal Medicine

## 2015-04-10 DIAGNOSIS — S61300A Unspecified open wound of right index finger with damage to nail, initial encounter: Secondary | ICD-10-CM | POA: Diagnosis not present

## 2015-04-11 NOTE — Progress Notes (Signed)
Christine [name] ([id number]) Visit Report for 04/10/2015 Chief Complaint Document Details Christine [name] Date of Service: 04/10/2015 8:45 AM Patient Name: A. Patient Account Number: [id number] Medical Record Treating RN: Christine Gouty RN, BSN, Christine [name] [id number] Number: Other Clinician: Date of Birth/Sex: Jul 06, 1932 (82 y.o. Female) Treating Christine [name] Primary Care Physician/Extender: Christine [name] Physician: Referring Physician: Olga [name] in Treatment: 1 Information Obtained from: Patient Chief Complaint Patient arrives for review of a necrotic wound on the dorsal aspect of her left in the index finger Electronic Signature(s) Signed: 04/10/2015 6:33:23 PM By: Christine Ham MD Entered By: Christine [name] on 04/10/2015 09:31:16 Christine [name], Christine A. ([id number]) -------------------------------------------------------------------------------- Debridement Details Christine [name] Date of Service: 04/10/2015 8:45 AM Patient Name: A. Patient Account Number: [id number] Medical Record Treating RN: Christine Gouty RN, BSN, Christine [name] [id number] Number: Other Clinician: Date of Birth/Sex: 20-Mar-1932 (82 y.o. Female) Treating Christine [name] Primary Care Physician/Extender: Christine [name] Physician: Referring Physician: Olga [name] in Treatment: 1 Debridement Performed for Wound #1 Left,Dorsal Hand - 1st Digit Assessment: Performed By: Physician Christine Dillon, MD Debridement: Debridement Pre-procedure Yes Verification/Time Out Taken: Start Time: 09:06 Pain Control: Lidocaine 4% Topical Solution Level: Skin/Subcutaneous Tissue Total Area Debrided (L x 1 (cm) x 1.5 (cm) = 1.5 (cm) W): Tissue and other Non-Viable, Fibrin/Slough, Subcutaneous material debrided: Instrument: Curette Bleeding: Minimum Hemostasis Achieved: Pressure End Time: 09:09 Procedural Pain: 0 Post Procedural Pain: 0 Response to Treatment: Procedure was tolerated well Post Debridement  Measurements of Total Wound Length: (cm) 1 Width: (cm) 1.5 Depth: (cm) 0.1 Volume: (cm) 0.118 Post Procedure Diagnosis Same as Pre-procedure Electronic Signature(s) Signed: 04/10/2015 5:36:13 PM By: Christine [name] BSN, RN Signed: 04/10/2015 6:33:23 PM By: Christine Ham MD Entered By: Christine [name] on 04/10/2015 09:31:04 Christine [name] ([id number]) Christine [name], Christine A. ([id number]) -------------------------------------------------------------------------------- HPI Details Christine [name] Date of Service: 04/10/2015 8:45 AM Patient Name: A. Patient Account Number: [id number] Medical Record Treating RN: Christine Gouty RN, BSN, Christine [name] [id number] Number: Other Clinician: Date of Birth/Sex: [birth year] (82 y.o. Female) Treating Christine [name] Primary Care Physician/Extender: Christine [name] Physician: Referring Physician: Olga [name] in Treatment: 1 History of Present Illness HPI Description: 04/03/15 Christine [name] is a pleasant independent 82 year old woman who arrives today accompanied by her daughter. She tells me that roughly 2 weeks ago she noted swelling of the distal part of her left index finger. She denies any specific injury but in particular she denies any form of bite injury either herself or pets. She had no trauma to the finger that she knows of. In any case the swelling became worse and the pain became more severe. Initially she hid this from her daughter but eventually showed it to her. She ended up being admitted to [facility] on 3/11 she had a felon of her finger along with cellulitis. The infected area was decompressed and her nail bed was removed due to infection. The wound was irrigated and saline dressing was applied.. The culture of this grew methicillin sensitive staph aureus. She was ultimately discharged on Keflex which she completed one to 2 days ago. She saw her primary physician at the current normal clinic on 3/15. He noted  prior granulation tissue. Use silver nitrate and use Xeroform. 04/10/15; the patient arrives having taken 1 week of doxycycline. Deep culture of the wound that I did last week was negative which I found somewhat surprising.. She has been using Aquacel Ag to the wound area changing daily Electronic Signature(s) Signed:  04/10/2015 6:33:23 PM By: Christine Ham MD Entered By: Christine [name] on 04/10/2015 09:32:28 Christine [name] ([id number]) -------------------------------------------------------------------------------- Physical Exam Details Christine [name] Date of Service: 04/10/2015 8:45 AM Patient Name: A. Patient Account Number: [id number] Medical Record Treating RN: Christine Gouty RN, BSN, Christine [name] [id number] Number: Other Clinician: Date of Birth/Sex: [birth year] (82 y.o. Female) Treating Christine [name] Primary Care Physician/Extender: Christine [name] Physician: Referring Physician: Olga [name] in Treatment: 1 Notes Wound exam; the area is over the distal aspect of her left index finger. The degree of erythema which last week at extended be on her finger towards the metacarpophalangeal joint has receded dramatically. She still has involvement until just proximal to the DIP joint. The MCP and PIP joints seem normal there is still some tenderness at the DIP. The wound itself which is on the dorsal aspect of the finger appears to be closing. There is no palpable bone Electronic Signature(s) Signed: 04/10/2015 6:33:23 PM By: Christine Ham MD Entered By: Christine [name] on 04/10/2015 09:34:02 Christine [name] ([id number]) -------------------------------------------------------------------------------- Physician Orders Details Christine [name] Date of Service: 04/10/2015 8:45 AM Patient Name: A. Patient Account Number: [id number] Medical Record Treating RN: Christine Gouty RN, BSN, Christine [name] [id number] Number: Other Clinician: Date of Birth/Sex: 1932-03-12 (82 y.o. Female) Treating  Christine [name] Primary Care Physician/Extender: Christine [name] Physician: Referring Physician: Olga [name] in Treatment: 1 Verbal / Phone Orders: Yes Clinician: Afful, RN, BSN, [name] Read Back and Verified: Yes Diagnosis Coding Wound Cleansing Wound #1 Left,Dorsal Hand - 1st Digit o Cleanse wound with mild soap and water o May Shower, gently pat wound dry prior to applying new dressing. Anesthetic Wound #1 Left,Dorsal Hand - 1st Digit o Topical Lidocaine 4% cream applied to wound bed prior to debridement Primary Wound Dressing Wound #1 Left,Dorsal Hand - 1st Digit o Aquacel Ag Secondary Dressing Wound #1 Left,Dorsal Hand - 1st Digit o Gauze and Kerlix/Conform Dressing Change Frequency Wound #1 Left,Dorsal Hand - 1st Digit o Change dressing every other day. Follow-up Appointments Wound #1 Left,Dorsal Hand - 1st Digit o Return Appointment in 1 week. Additional Orders / Instructions Wound #1 Left,Dorsal Hand - 1st Digit o Increase protein intake. o Activity as tolerated Medications-please add to medication list. Wound #1 Left,Dorsal Hand - 1st Digit Keidel, Beyza A. ([id number]) o P.O. Antibiotics - Continue Doxy 100 mg BID x 7 days Electronic Signature(s) Signed: 04/10/2015 5:36:13 PM By: Christine [name] BSN, RN Signed: 04/10/2015 6:33:23 PM By: Christine Ham MD Entered By: Christine [name] on 04/10/2015 09:10:37 Christine [name], Christine [name] Kitchen ([id number]) -------------------------------------------------------------------------------- Prescription 04/10/2015 Patient Name: Christine [name] A. Physician: Christine Dillon MD Date of Birth: 1932/12/28 NPI#: [id number] Sex: F DEA#: [id number] Phone #: [phone] License #: [id number] Patient Address: [place] [address] [place] [place], [place] [address] [facility] [address], [place], [place] [address] [phone] Allergies ACE  Inhibitors Cardizem Dyazide Norvasc Penicillins Sulfa (Sulfonamide Antibiotics) Physician's Orders P.O. Antibiotics - Continue Doxy 100 mg BID x 7 days Signature(s): Date(s): Electronic Signature(s) SHELEEN, EMICK ([id number]) Signed: 04/10/2015 6:33:23 PM By: Christine Ham MD Entered By: Christine [name] on 04/10/2015 09:37:02 Christine [name], Christine [name] ([id number]) --------------------------------------------------------------------------------  Problem List Details Christine [name] Date of Service: 04/10/2015 8:45 AM Patient Name: A. Patient Account Number: [id number] Medical Record Treating RN: Christine Gouty RN, BSN, Christine [name] [id number] Number: Other Clinician: Date of Birth/Sex: 1932-11-21 (82 y.o. Female) Treating Mikalyn Hermida Primary Care Physician/Extender: Christine [name] Physician: Referring Physician: Sarina Ser,  [place] in Treatment: 1 Active Problems ICD-10 Encounter Code Description Active Date Diagnosis S61.300A Unspecified open wound of right index finger with damage 04/03/2015 Yes to nail, initial encounter L03.114 Cellulitis of left upper limb 04/03/2015 Yes Inactive Problems Resolved Problems Electronic Signature(s) Signed: 04/10/2015 6:33:23 PM By: Christine Ham MD Entered By: Christine [name] on 04/10/2015 09:30:51 Christine [name], Christine [name] Kitchen ([id number]) -------------------------------------------------------------------------------- Progress Note Details Christine [name] Date of Service: 04/10/2015 8:45 AM Patient Name: A. Patient Account Number: [id number] Medical Record Treating RN: Christine Gouty RN, BSN, Christine [name] [id number] Number: Other Clinician: Date of Birth/Sex: 02/11/1932 (82 y.o. Female) Treating Conway Fedora Primary Care Physician/Extender: Christine [name] Physician: Referring Physician: Olga [name] in Treatment: 1 Subjective Chief Complaint Information obtained from Patient Patient arrives for review of a necrotic wound on the  dorsal aspect of her left in the index finger History of Present Illness (HPI) 04/03/15 Mrs. Parral is a pleasant independent 82 year old woman who arrives today accompanied by her daughter. She tells me that roughly 2 weeks ago she noted swelling of the distal part of her left index finger. She denies any specific injury but in particular she denies any form of bite injury either herself or pets. She had no trauma to the finger that she knows of. In any case the swelling became worse and the pain became more severe. Initially she hid this from her daughter but eventually showed it to her. She ended up being admitted to [facility] on 3/11 she had a felon of her finger along with cellulitis. The infected area was decompressed and her nail bed was removed due to infection. The wound was irrigated and saline dressing was applied.. The culture of this grew methicillin sensitive staph aureus. She was ultimately discharged on Keflex which she completed one to 2 days ago. She saw her primary physician at the current normal clinic on 3/15. He noted prior granulation tissue. Use silver nitrate and use Xeroform. 04/10/15; the patient arrives having taken 1 week of doxycycline. Deep culture of the wound that I did last week was negative which I found somewhat surprising.. She has been using Aquacel Ag to the wound area changing daily Objective Constitutional Vitals Time Taken: 8:48 AM, Height: 65 in, Weight: 219 lbs, BMI: 36.4, Temperature: 97.6 F, Pulse: 61 bpm, Respiratory Rate: 18 breaths/min, Blood Pressure: 136/57 mmHg. Integumentary (Hair, Skin) Wound #1 status is Open. Original cause of wound was Gradually Appeared. The wound is located on the [place], BRYNLEA A. ([id number]) Left,Dorsal Hand - 1st Digit. The wound measures 1cm length x 1.5cm width x 0.1cm depth; 1.178cm^2 area and 0.118cm^3 volume. The wound is limited to skin breakdown. There is no tunneling or  undermining noted. There is a small amount of serosanguineous drainage noted. The wound margin is distinct with the outline attached to the wound base. There is medium (34-66%) pink granulation within the wound bed. There is a medium (34-66%) amount of necrotic tissue within the wound bed including Adherent Slough. The periwound skin appearance exhibited: Moist. The periwound skin appearance did not exhibit: Callus, Crepitus, Excoriation, Fluctuance, Friable, Induration, Localized Edema, Rash, Scarring, Dry/Scaly, Maceration, Atrophie Blanche, Cyanosis, Ecchymosis, Hemosiderin Staining, Mottled, Pallor, Rubor, Erythema. Periwound temperature was noted as No Abnormality. The periwound has tenderness on palpation. Assessment Active Problems ICD-10 S61.300A - Unspecified open wound of right index finger with damage to nail, initial encounter L03.114 - Cellulitis of left upper limb Procedures Wound #1 Wound #1 is an Abscess located on the Left,Dorsal Hand -  1st Digit . There was a Skin/Subcutaneous Tissue Debridement [id number]) debridement with total area of 1.5 sq cm performed by Christine Dillon, MD. with the following instrument(s): Curette to remove Non-Viable tissue/material including Fibrin/Slough and Subcutaneous after achieving pain control using Lidocaine 4% Topical Solution. A time out was conducted prior to the start of the procedure. A Minimum amount of bleeding was controlled with Pressure. The procedure was tolerated well with a pain level of 0 throughout and a pain level of 0 following the procedure. Post Debridement Measurements: 1cm length x 1.5cm width x 0.1cm depth; 0.118cm^3 volume. Post procedure Diagnosis Wound #1: Same as Pre-Procedure Plan Wound Cleansing: Wound #1 Left,Dorsal Hand - 1st Digit: Christine [name], Christine A. ([id number]) Cleanse wound with mild soap and water May Shower, gently pat wound dry prior to applying new dressing. Anesthetic: Wound #1  Left,Dorsal Hand - 1st Digit: Topical Lidocaine 4% cream applied to wound bed prior to debridement Primary Wound Dressing: Wound #1 Left,Dorsal Hand - 1st Digit: Aquacel Ag Secondary Dressing: Wound #1 Left,Dorsal Hand - 1st Digit: Gauze and Kerlix/Conform Dressing Change Frequency: Wound #1 Left,Dorsal Hand - 1st Digit: Change dressing every other day. Follow-up Appointments: Wound #1 Left,Dorsal Hand - 1st Digit: Return Appointment in 1 week. Additional Orders / Instructions: Wound #1 Left,Dorsal Hand - 1st Digit: Increase protein intake. Activity as tolerated Medications-please add to medication list.: Wound #1 Left,Dorsal Hand - 1st Digit: P.O. Antibiotics - Continue Doxy 100 mg BID x 7 days #1 the wound itself appears to be improved we will continue with the Aquacel Ag and Kerlix wrap 2. The culture I did last week which I thought was perhaps from the PIP joint was negative. In spite of this I think she needs continued antibiotic therapy and I have extended the doxycycline for a total of 2 weeks. I may have written this last week unfortunately we cannot find the actual prescription. I have instructed the daughter to call us if they run out of on antibiotics before this. 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 #4 I will continue antibiotics for another week make a decision about referral next week which would need to be either to an orthopedic surgeon or hand surgeon for aspiration of the DIP joint. However we will see how this goes. Quite a dramatic improvement this week Electronic Signature(s) Signed: 04/10/2015 6:33:23 PM By: Christine Ham MD Entered By: Christine [name] on 04/10/2015 09:36:33 Christine [name], Christine [name]  ([id number]) -------------------------------------------------------------------------------- SuperBill Details Christine [name] Date of Service: 04/10/2015 Patient Name: A. Patient Account Number: [id number] Medical Record Treating RN: Christine Gouty RN, BSN, Christine [name] [id number] Number: Other Clinician: Date of Birth/Sex: January 07, 1933 (82 y.o. Female) Treating Safa Derner Primary Care Physician/Extender: Christine Royals [name] Physician: Suella Grove in Treatment: 1 Referring Physician: Lynett Fish Diagnosis Coding ICD-10 Codes Code Description S61.300A Unspecified open wound of right index finger with damage to nail, initial encounter L03.114 Cellulitis of left upper limb Facility Procedures CPT4: Description Modifier Quantity Code [id number] 11042 - DEB SUBQ TISSUE 20 SQ CM/< 1 ICD-10 Description Diagnosis S61.300A Unspecified open wound of right index finger with damage to nail, initial encounter Physician Procedures CPT4: Description Modifier Quantity Code [id number] 11042 - WC PHYS SUBQ TISS 20 SQ CM 1 ICD-10 Description Diagnosis S61.300A Unspecified open wound of right index finger  with damage to nail, initial encounter Electronic Signature(s) Signed: 04/10/2015 6:33:23 PM By: Christine Ham MD Entered By: Christine [name] on 04/10/2015 09:36:54

## 2015-04-11 NOTE — Progress Notes (Signed)
Christine [name], Christine [name] ([id number]) Visit Report for 04/10/2015 Arrival Information Details [name], [name] Date of Service: 04/10/2015 8:45 AM Patient Name: A. Patient Account Number: [id number] Medical Record Treating RN: Baruch Gouty RN, BSN, Velva Harman [id number] Number: Other Clinician: Date of Birth/Sex: [birth year] (82 y.o. Female) Treating Dellia Nims, MICHAEL Primary Care Physician/Extender: Delorise Royals, John Physician: Referring Physician: Olga Millers in Treatment: 1 Visit Information History Since Last Visit Added or deleted any medications: No Patient Arrived: Ambulatory Any new allergies or adverse reactions: No Arrival Time: 08:46 Had a fall or experienced change in No Accompanied By: dtr activities of daily living that may affect Transfer Assistance: None risk of falls: Patient Identification Verified: Yes Signs or symptoms of abuse/neglect since last No Secondary Verification Process Yes visito Completed: Hospitalized since last visit: No Patient Requires Transmission-Based No Has Dressing in Place as Prescribed: Yes Precautions: Pain Present Now: No Patient Has Alerts: No Electronic Signature(s) Signed: 04/10/2015 5:36:13 PM By: Regan Lemming BSN, RN Entered By: Regan Lemming on 04/10/2015 08:47:10 Christine [name] ([id number]) -------------------------------------------------------------------------------- Encounter Discharge Information Details Christine [name] Date of Service: 04/10/2015 8:45 AM Patient Name: A. Patient Account Number: [id number] Medical Record Treating RN: Baruch Gouty RN, BSN, Velva Harman [id number] Number: Other Clinician: Date of Birth/Sex: [birth year] (82 y.o. Female) Treating ROBSON, MICHAEL Primary Care Physician/Extender: Delorise Royals, John Physician: Referring Physician: Olga Millers in Treatment: 1 Encounter Discharge Information Items Discharge Pain Level: 0 Discharge Condition: Stable Ambulatory Status: Ambulatory Discharge  Destination: Home Transportation: Private Auto Accompanied By: dtr Schedule Follow-up Appointment: No Medication Reconciliation completed and provided to Patient/Care No Daneka Lantigua: Provided on Clinical Summary of Care: 04/10/2015 Form Type Recipient Paper Patient MF Electronic Signature(s) Signed: 04/10/2015 9:22:29 AM By: Ruthine Dose Entered By: Ruthine Dose on 04/10/2015 09:22:29 Christine [name], Christine [name] Kitchen ([id number]) -------------------------------------------------------------------------------- Multi Wound Chart Details Christine [name] Date of Service: 04/10/2015 8:45 AM Patient Name: A. Patient Account Number: [id number] Medical Record Treating RN: Baruch Gouty RN, BSN, Velva Harman [id number] Number: Other Clinician: Date of Birth/Sex: 11/08/1932 (82 y.o. Female) Treating ROBSON, MICHAEL Primary Care Physician/Extender: Delorise Royals, John Physician: Referring Physician: Olga Millers in Treatment: 1 Vital Signs Height(in): 65 Pulse(bpm): 61 Weight(lbs): 219 Blood Pressure 136/57 (mmHg): Body Mass Index(BMI): 36 Temperature(F): 97.6 Respiratory Rate 18 (breaths/min): Photos: [1:No Photos] [N/A:N/A] Wound Location: [1:Left Hand - 1st Digit - Dorsal] [N/A:N/A] Wounding Event: [1:Gradually Appeared] [N/A:N/A] Primary Etiology: [1:Abscess] [N/A:N/A] Comorbid History: [1:Cataracts, Anemia, Arrhythmia, Congestive Heart Failure, Hypertension, Osteoarthritis] [N/A:N/A] Date Acquired: [1:03/20/2015] [N/A:N/A] Weeks of Treatment: [1:1] [N/A:N/A] Wound Status: [1:Open] [N/A:N/A] Measurements L x W x D 1x1.5x0.1 [N/A:N/A] (cm) Area (cm) : [1:1.178] [N/A:N/A] Volume (cm) : [1:0.118] [N/A:N/A] % Reduction in Area: [1:75.00%] [N/A:N/A] % Reduction in Volume: 74.90% [N/A:N/A] Classification: [1:Full Thickness Without Exposed Support Structures] [N/A:N/A] Exudate Amount: [1:Small] [N/A:N/A] Exudate Type: [1:Serosanguineous] [N/A:N/A] Exudate Color: [1:red, brown]  [N/A:N/A] Wound Margin: [1:Distinct, outline attached] [N/A:N/A] Granulation Amount: [1:Medium (34-66%)] [N/A:N/A] Granulation Quality: Pink N/A N/A Necrotic Amount: Medium (34-66%) N/A N/A Exposed Structures: Fascia: No N/A N/A Fat: No Tendon: No Muscle: No Joint: No Bone: No Limited to Skin Breakdown Epithelialization: Medium (34-66%) N/A N/A Periwound Skin Texture: Edema: No N/A N/A Excoriation: No Induration: No Callus: No Crepitus: No Fluctuance: No Friable: No Rash: No Scarring: No Periwound Skin Moist: Yes N/A N/A Moisture: Maceration: No Dry/Scaly: No Periwound Skin Color: Atrophie Blanche: No N/A N/A Cyanosis: No Ecchymosis: No Erythema: No Hemosiderin Staining: No Mottled: No Pallor: No Rubor: No Temperature: No Abnormality N/A N/A Tenderness on  Yes N/A N/A Palpation: Wound Preparation: Ulcer Cleansing: N/A N/A Rinsed/Irrigated with Saline Topical Anesthetic Applied: Other: lidocaine 4% Treatment Notes Electronic Signature(s) Signed: 04/10/2015 5:36:13 PM By: Regan Lemming BSN, RN Entered By: Regan Lemming on 04/10/2015 09:05:28 Christine [name] ([id number]) -------------------------------------------------------------------------------- [place] Details Christine [name] Date of Service: 04/10/2015 8:45 AM Patient Name: A. Patient Account Number: [id number] Medical Record Treating RN: Baruch Gouty RN, BSN, Velva Harman [id number] Number: Other Clinician: Date of Birth/Sex: [birth year] (82 y.o. Female) Treating Dellia Nims, MICHAEL Primary Care Physician/Extender: Delorise Royals, John Physician: Referring Physician: Olga Millers in Treatment: 1 Active Inactive Orientation to the Wound Care Program Nursing Diagnoses: Knowledge deficit related to the wound healing center program Goals: Patient/caregiver will verbalize understanding of the [facility] Date Initiated: 04/03/2015 Goal Status:  Active Interventions: Provide education on orientation to the wound center Notes: Wound/Skin Impairment Nursing Diagnoses: Impaired tissue integrity Knowledge deficit related to ulceration/compromised skin integrity Goals: Patient/caregiver will verbalize understanding of skin care regimen Date Initiated: 04/03/2015 Goal Status: Active Ulcer/skin breakdown will have a volume reduction of 30% by week 4 Date Initiated: 04/03/2015 Goal Status: Active Ulcer/skin breakdown will have a volume reduction of 50% by week 8 Date Initiated: 04/03/2015 Goal Status: Active Ulcer/skin breakdown will have a volume reduction of 80% by week 12 Date Initiated: 04/03/2015 Goal Status: Active Christine [name], Christine A. ([id number]) Ulcer/skin breakdown will heal within 14 weeks Date Initiated: 04/03/2015 Goal Status: Active Interventions: Assess patient/caregiver ability to obtain necessary supplies Assess patient/caregiver ability to perform ulcer/skin care regimen upon admission and as needed Assess ulceration(s) every visit Provide education on ulcer and skin care Treatment Activities: Patient referred to home care : 04/10/2015 Referred to DME Cabela Pacifico for dressing supplies : 04/10/2015 Skin care regimen initiated : 04/10/2015 Topical wound management initiated : 04/10/2015 Notes: Electronic Signature(s) Signed: 04/10/2015 5:36:13 PM By: Regan Lemming BSN, RN Entered By: Regan Lemming on 04/10/2015 08:57:53 Christine [name], Christine [name] Kitchen ([id number]) -------------------------------------------------------------------------------- Pain Assessment Details Christine [name] Date of Service: 04/10/2015 8:45 AM Patient Name: A. Patient Account Number: [id number] Medical Record Treating RN: Baruch Gouty RN, BSN, Velva Harman [id number] Number: Other Clinician: Date of Birth/Sex: May 13, 1932 (82 y.o. Female) Treating Dellia Nims, MICHAEL Primary Care Physician/Extender: Delorise Royals, John Physician: Referring Physician: Olga Millers in Treatment: 1 Active Problems Location of Pain Severity and Description of Pain Patient Has Paino No Site Locations Pain Management and Medication Current Pain Management: Electronic Signature(s) Signed: 04/10/2015 5:36:13 PM By: Regan Lemming BSN, RN Entered By: Regan Lemming on 04/10/2015 08:47:16 Christine [name] ([id number]) -------------------------------------------------------------------------------- Patient/Caregiver Education Details Christine [name] Date of Service: 04/10/2015 8:45 AM Patient Name: A. Patient Account Number: [id number] Medical Record Treating RN: Baruch Gouty RN, BSN, Velva Harman [id number] Number: Other Clinician: Date of Birth/Gender: [birth year] (82 y.o. Female) Treating Dellia Nims, MICHAEL Primary Care Physician/Extender: Delorise Royals John Physician: Suella Grove in Treatment: 1 Referring Physician: Lynett Fish Education Assessment Education Provided To: Patient Education Topics Provided Basic Hygiene: Methods: Explain/Verbal Responses: State content correctly Welcome To [facility] [place]: Methods: Explain/Verbal Responses: State content correctly Wound/Skin Impairment: Methods: Explain/Verbal Responses: State content correctly Electronic Signature(s) Signed: 04/10/2015 5:36:13 PM By: Regan Lemming BSN, RN Entered By: Regan Lemming on 04/10/2015 09:13:07 Christine [name] ([id number]) -------------------------------------------------------------------------------- Wound Assessment Details Christine [name] Date of Service: 04/10/2015 8:45 AM Patient Name: A. Patient Account Number: [id number] Medical Record Treating RN: Baruch Gouty RN, BSN, Velva Harman [id number] Number: Other Clinician: Date of Birth/Sex: [birth year] (82 y.o. Female) Treating ROBSON, MICHAEL Primary Care Physician/Extender: Darnell Level  Sarina Ser, John Physician: Referring Physician: Olga Millers in Treatment: 1 Wound Status Wound Number: 1 Primary Abscess Etiology: Wound  Location: Left Hand - 1st Digit - Dorsal Wound Open Wounding Event: Gradually Appeared Status: Date Acquired: 03/20/2015 Comorbid Cataracts, Anemia, Arrhythmia, Weeks Of Treatment: 1 History: Congestive Heart Failure, Clustered Wound: No Hypertension, Osteoarthritis Photos Photo Uploaded By: Regan Lemming on 04/10/2015 17:28:53 Wound Measurements Length: (cm) 1 Width: (cm) 1.5 Depth: (cm) 0.1 Area: (cm) 1.178 Volume: (cm) 0.118 % Reduction in Area: 75% % Reduction in Volume: 74.9% Epithelialization: Medium (34-66%) Tunneling: No Undermining: No Wound Description Full Thickness Without Exposed Classification: Support Structures Wound Margin: Distinct, outline attached Exudate Small Amount: Exudate Type: Serosanguineous Exudate Color: red, brown Christine [name], Christine A. ([id number]) Foul Odor After Cleansing: No Wound Bed Granulation Amount: Medium (34-66%) Exposed Structure Granulation Quality: Pink Fascia Exposed: No Necrotic Amount: Medium (34-66%) Fat Layer Exposed: No Necrotic Quality: Adherent Slough Tendon Exposed: No Muscle Exposed: No Joint Exposed: No Bone Exposed: No Limited to Skin Breakdown Periwound Skin Texture Texture Color No Abnormalities Noted: No No Abnormalities Noted: No Callus: No Atrophie Blanche: No Crepitus: No Cyanosis: No Excoriation: No Ecchymosis: No Fluctuance: No Erythema: No Friable: No Hemosiderin Staining: No Induration: No Mottled: No Localized Edema: No Pallor: No Rash: No Rubor: No Scarring: No Temperature / Pain Moisture Temperature: No Abnormality No Abnormalities Noted: No Tenderness on Palpation: Yes Dry / Scaly: No Maceration: No Moist: Yes Wound Preparation Ulcer Cleansing: Rinsed/Irrigated with Saline Topical Anesthetic Applied: Other: lidocaine 4%, Treatment Notes Wound #1 (Left, Dorsal Hand - 1st Digit) 1. Cleansed with: Clean wound with Normal Saline 4. Dressing Applied: Aquacel Ag 5.  Secondary Dressing Applied Gauze and Kerlix/Conform 7. Secured with Recruitment consultant) Signed: 04/10/2015 5:36:13 PM By: Regan Lemming BSN, RN Entered By: Regan Lemming on 04/10/2015 08:57:48 Christine [name] ([id number]) Christine [name], Christine [name] ([id number]) -------------------------------------------------------------------------------- Vitals Details Christine [name] Date of Service: 04/10/2015 8:45 AM Patient Name: A. Patient Account Number: [id number] Medical Record Treating RN: Baruch Gouty RN, BSN, Velva Harman [id number] Number: Other Clinician: Date of Birth/Sex: 05-Jan-1933 (82 y.o. Female) Treating ROBSON, MICHAEL Primary Care Physician/Extender: Delorise Royals, John Physician: Referring Physician: Olga Millers in Treatment: 1 Vital Signs Time Taken: 08:48 Temperature (F): 97.6 Height (in): 65 Pulse (bpm): 61 Weight (lbs): 219 Respiratory Rate (breaths/min): 18 Body Mass Index (BMI): 36.4 Blood Pressure (mmHg): 136/57 Reference Range: 80 - 120 mg / dl Electronic Signature(s) Signed: 04/10/2015 5:36:13 PM By: Regan Lemming BSN, RN Entered By: Regan Lemming on 04/10/2015 08:49:21

## 2015-04-17 ENCOUNTER — Encounter: Payer: Medicare Other | Attending: Internal Medicine | Admitting: Internal Medicine

## 2015-04-17 DIAGNOSIS — Z882 Allergy status to sulfonamides status: Secondary | ICD-10-CM | POA: Insufficient documentation

## 2015-04-17 DIAGNOSIS — M1990 Unspecified osteoarthritis, unspecified site: Secondary | ICD-10-CM | POA: Diagnosis not present

## 2015-04-17 DIAGNOSIS — X58XXXA Exposure to other specified factors, initial encounter: Secondary | ICD-10-CM | POA: Insufficient documentation

## 2015-04-17 DIAGNOSIS — L03114 Cellulitis of left upper limb: Secondary | ICD-10-CM | POA: Insufficient documentation

## 2015-04-17 DIAGNOSIS — S61300A Unspecified open wound of right index finger with damage to nail, initial encounter: Secondary | ICD-10-CM | POA: Insufficient documentation

## 2015-04-17 DIAGNOSIS — I110 Hypertensive heart disease with heart failure: Secondary | ICD-10-CM | POA: Diagnosis not present

## 2015-04-17 DIAGNOSIS — Z880 Allergy status to penicillin: Secondary | ICD-10-CM | POA: Diagnosis not present

## 2015-04-17 DIAGNOSIS — I509 Heart failure, unspecified: Secondary | ICD-10-CM | POA: Diagnosis not present

## 2015-04-18 NOTE — Progress Notes (Signed)
[name], [name] ([id number]) Visit Report for 04/17/2015 Arrival Information Details [name], [name] Date of Service: 04/17/2015 8:00 AM [name] Name: Christine [name] Account Number: [id number] Medical Record Treating RN: Baruch Gouty RN, BSN, Velva Harman [id number] Number: Other Clinician: Date of Birth/Sex: [birth year] (82 y.o. Female) Treating Dellia Nims, MICHAEL Primary Care Physician/Extender: Delorise Royals, John Physician: Referring Physician: Olga Millers in Treatment: 2 Visit Information History Since Last Visit Added or deleted any medications: No [name] Arrived: Ambulatory Any new allergies or adverse reactions: No Arrival Time: 08:01 Had a fall or experienced change in No Accompanied By: dtr activities of daily living that may affect Transfer Assistance: None risk of falls: [name] Identification Verified: Yes Signs or symptoms of abuse/neglect since last No Secondary Verification Process Yes visito Completed: Hospitalized since last visit: No [name] Requires Transmission-Based No Has Dressing in Place as Prescribed: Yes Precautions: Pain Present Now: No [name] Has Alerts: No Electronic Signature(s) Signed: 04/17/2015 4:43:54 PM By: Regan Lemming BSN, RN Entered By: Regan Lemming on 04/17/2015 08:02:47 Cathleen Corti ([id number]) -------------------------------------------------------------------------------- Encounter Discharge Information Details Christine [name] Date of Service: 04/17/2015 8:00 AM [name] Name: Christine [name] Account Number: [id number] Medical Record Treating RN: Baruch Gouty RN, BSN, Velva Harman [id number] Number: Other Clinician: Date of Birth/Sex: 08-Aug-1932 (82 y.o. Female) Treating ROBSON, MICHAEL Primary Care Physician/Extender: Delorise Royals, John Physician: Referring Physician: Olga Millers in Treatment: 2 Encounter Discharge Information Items Discharge Pain Level: 0 Discharge Condition: Stable Ambulatory Status: Ambulatory Discharge  Destination: Home Transportation: Private Auto Accompanied By: dtr Schedule Follow-up Appointment: No Medication Reconciliation completed and provided to [name]/Care No Makhi Muzquiz: Provided on Clinical Summary of Care: 04/17/2015 Form Type Recipient Paper [name] MF Electronic Signature(s) Signed: 04/17/2015 8:30:48 AM By: Ruthine Dose Entered By: Ruthine Dose on 04/17/2015 08:30:48 Centola, Rotunda A. ([id number]) -------------------------------------------------------------------------------- Multi Wound Chart Details Christine [name] Date of Service: 04/17/2015 8:00 AM [name] Name: Christine [name] Account Number: [id number] Medical Record Treating RN: Baruch Gouty RN, BSN, Velva Harman [id number] Number: Other Clinician: Date of Birth/Sex: 04-Nov-1932 (82 y.o. Female) Treating ROBSON, MICHAEL Primary Care Physician/Extender: Delorise Royals, John Physician: Referring Physician: Olga Millers in Treatment: 2 Vital Signs Height(in): 65 Pulse(bpm): 61 Weight(lbs): 219 Blood Pressure 129/82 (mmHg): Body Mass Index(BMI): 36 Temperature(F): 97.7 Respiratory Rate 18 (breaths/min): Photos: [1:No Photos] [N/A:N/A] Wound Location: [1:Left Hand - 1st Digit - Dorsal] [N/A:N/A] Wounding Event: [1:Gradually Appeared] [N/A:N/A] Primary Etiology: [1:Abscess] [N/A:N/A] Comorbid History: [1:Cataracts, Anemia, Arrhythmia, Congestive Heart Failure, Hypertension, Osteoarthritis] [N/A:N/A] Date Acquired: [1:03/20/2015] [N/A:N/A] Weeks of Treatment: [1:2] [N/A:N/A] Wound Status: [1:Open] [N/A:N/A] Measurements L x W x D 0.5x1x0.1 [N/A:N/A] (cm) Area (cm) : [1:0.393] [N/A:N/A] Volume (cm) : [1:0.039] [N/A:N/A] % Reduction in Area: [1:91.70%] [N/A:N/A] % Reduction in Volume: 91.70% [N/A:N/A] Classification: [1:Full Thickness Without Exposed Support Structures] [N/A:N/A] Exudate Amount: [1:Small] [N/A:N/A] Exudate Type: [1:Serosanguineous] [N/A:N/A] Exudate Color: [1:red, brown] [N/A:N/A] Wound  Margin: [1:Distinct, outline attached] [N/A:N/A] Granulation Amount: [1:Large (67-100%)] [N/A:N/A] Granulation Quality: Pink N/A N/A Necrotic Amount: Small (1-33%) N/A N/A Exposed Structures: Fascia: No N/A N/A Fat: No Tendon: No Muscle: No Joint: No Bone: No Limited to Skin Breakdown Epithelialization: Large (67-100%) N/A N/A Periwound Skin Texture: Edema: No N/A N/A Excoriation: No Induration: No Callus: No Crepitus: No Fluctuance: No Friable: No Rash: No Scarring: No Periwound Skin Moist: Yes N/A N/A Moisture: Maceration: No Dry/Scaly: No Periwound Skin Color: Atrophie Blanche: No N/A N/A Cyanosis: No Ecchymosis: No Erythema: No Hemosiderin Staining: No Mottled: No Pallor: No Rubor: No Temperature: No Abnormality N/A N/A Tenderness on  Yes N/A N/A Palpation: Wound Preparation: Ulcer Cleansing: N/A N/A Rinsed/Irrigated with Saline Topical Anesthetic Applied: Other: lidocaine 4% Treatment Notes Electronic Signature(s) Signed: 04/17/2015 4:43:54 PM By: Regan Lemming BSN, RN Entered By: Regan Lemming on 04/17/2015 08:16:06 Cathleen Corti ([id number]) -------------------------------------------------------------------------------- Multi-Disciplinary Care Plan Details Christine [name] Date of Service: 04/17/2015 8:00 AM [name] Name: Christine [name] Account Number: [id number] Medical Record Treating RN: Baruch Gouty RN, BSN, Velva Harman [id number] Number: Other Clinician: Date of Birth/Sex: 1932/08/19 (82 y.o. Female) Treating Dellia Nims, MICHAEL Primary Care Physician/Extender: Delorise Royals, John Physician: Referring Physician: Olga Millers in Treatment: 2 Active Inactive Orientation to the Wound Care Program Nursing Diagnoses: Knowledge deficit related to the wound healing center program Goals: [name]/caregiver will verbalize understanding of the [facility] Program Date Initiated: 04/03/2015 Goal Status: Active Interventions: Provide education on  orientation to the wound center Notes: Wound/Skin Impairment Nursing Diagnoses: Impaired tissue integrity Knowledge deficit related to ulceration/compromised skin integrity Goals: [name]/caregiver will verbalize understanding of skin care regimen Date Initiated: 04/03/2015 Goal Status: Active Ulcer/skin breakdown will have a volume reduction of 30% by week 4 Date Initiated: 04/03/2015 Goal Status: Active Ulcer/skin breakdown will have a volume reduction of 50% by week 8 Date Initiated: 04/03/2015 Goal Status: Active Ulcer/skin breakdown will have a volume reduction of 80% by week 12 Date Initiated: 04/03/2015 Goal Status: Active Meulemans, Ayeza A. ([id number]) Ulcer/skin breakdown will heal within 14 weeks Date Initiated: 04/03/2015 Goal Status: Active Interventions: Assess [name]/caregiver ability to obtain necessary supplies Assess [name]/caregiver ability to perform ulcer/skin care regimen upon admission and as needed Assess ulceration(s) every visit Provide education on ulcer and skin care Treatment Activities: [name] referred to home care : 04/17/2015 Referred to DME Yandel Zeiner for dressing supplies : 04/17/2015 Skin care regimen initiated : 04/17/2015 Topical wound management initiated : 04/17/2015 Notes: Electronic Signature(s) Signed: 04/17/2015 4:43:54 PM By: Regan Lemming BSN, RN Entered By: Regan Lemming on 04/17/2015 08:15:24 Christine [name] A. ([id number]) -------------------------------------------------------------------------------- Pain Assessment Details Christine [name] Date of Service: 04/17/2015 8:00 AM [name] Name: Christine [name] Account Number: [id number] Medical Record Treating RN: Baruch Gouty RN, BSN, Velva Harman [id number] Number: Other Clinician: Date of Birth/Sex: November 23, 1932 (82 y.o. Female) Treating Dellia Nims, MICHAEL Primary Care Physician/Extender: Delorise Royals, John Physician: Referring Physician: Olga Millers in Treatment: 2 Active Problems Location of  Pain Severity and Description of Pain [name] Has Paino No Site Locations Pain Management and Medication Current Pain Management: Electronic Signature(s) Signed: 04/17/2015 4:43:54 PM By: Regan Lemming BSN, RN Entered By: Regan Lemming on 04/17/2015 08:02:54 Cathleen Corti ([id number]) -------------------------------------------------------------------------------- [name]/Caregiver Education Details Christine [name] Date of Service: 04/17/2015 8:00 AM [name] Name: Christine [name] Account Number: [id number] Medical Record Treating RN: Baruch Gouty RN, BSN, Velva Harman [id number] Number: Other Clinician: Date of Birth/Gender: April 21, 1932 (82 y.o. Female) Treating Dellia Nims, MICHAEL Primary Care Physician/Extender: Delorise Royals John Physician: Suella Grove in Treatment: 2 Referring Physician: Lynett Fish Education Assessment Education Provided To: [name] and Caregiver Education Topics Provided Welcome To [facility]: Methods: Explain/Verbal Responses: State content correctly Wound/Skin Impairment: Methods: Explain/Verbal Responses: State content correctly Electronic Signature(s) Signed: 04/17/2015 4:43:54 PM By: Regan Lemming BSN, RN Entered By: Regan Lemming on 04/17/2015 08:28:55 Cathleen Corti ([id number]) -------------------------------------------------------------------------------- Wound Assessment Details Christine [name] Date of Service: 04/17/2015 8:00 AM [name] Name: Christine [name] Account Number: [id number] Medical Record Treating RN: Baruch Gouty RN, BSN, Velva Harman [id number] Number: Other Clinician: Date of Birth/Sex: November 21, 1932 (82 y.o. Female) Treating Dellia Nims, MICHAEL Primary Care Physician/Extender: Delorise Royals, John Physician: Referring Physician:  Sarina Ser, John Weeks in Treatment: 2 Wound Status Wound Number: 1 Primary Abscess Etiology: Wound Location: Left Hand - 1st Digit - Dorsal Wound Open Wounding Event: Gradually Appeared Status: Date Acquired: 03/20/2015 Comorbid  Cataracts, Anemia, Arrhythmia, Weeks Of Treatment: 2 History: Congestive Heart Failure, Clustered Wound: No Hypertension, Osteoarthritis Photos Photo Uploaded By: Regan Lemming on 04/17/2015 11:59:30 Wound Measurements Length: (cm) 0.5 Width: (cm) 1 Depth: (cm) 0.1 Area: (cm) 0.393 Volume: (cm) 0.039 % Reduction in Area: 91.7% % Reduction in Volume: 91.7% Epithelialization: Large (67-100%) Tunneling: No Undermining: No Wound Description Full Thickness Without Exposed Classification: Support Structures Wound Margin: Distinct, outline attached Exudate Small Amount: Exudate Type: Serosanguineous Exudate Color: red, brown Suleiman, Paw A. ([id number]) Foul Odor After Cleansing: No Wound Bed Granulation Amount: Large (67-100%) Exposed Structure Granulation Quality: Pink Fascia Exposed: No Necrotic Amount: Small (1-33%) Fat Layer Exposed: No Necrotic Quality: Adherent Slough Tendon Exposed: No Muscle Exposed: No Joint Exposed: No Bone Exposed: No Limited to Skin Breakdown Periwound Skin Texture Texture Color No Abnormalities Noted: No No Abnormalities Noted: No Callus: No Atrophie Blanche: No Crepitus: No Cyanosis: No Excoriation: No Ecchymosis: No Fluctuance: No Erythema: No Friable: No Hemosiderin Staining: No Induration: No Mottled: No Localized Edema: No Pallor: No Rash: No Rubor: No Scarring: No Temperature / Pain Moisture Temperature: No Abnormality No Abnormalities Noted: No Tenderness on Palpation: Yes Dry / Scaly: No Maceration: No Moist: Yes Wound Preparation Ulcer Cleansing: Rinsed/Irrigated with Saline Topical Anesthetic Applied: Other: lidocaine 4%, Treatment Notes Wound #1 (Left, Dorsal Hand - 1st Digit) 1. Cleansed with: Clean wound with Normal Saline 4. Dressing Applied: Aquacel Ag 5. Secondary Dressing Applied Gauze and Kerlix/Conform 7. Secured with Recruitment consultant) Signed: 04/17/2015 4:43:54 PM By: Regan Lemming BSN, RN Entered By: Regan Lemming on 04/17/2015 08:08:36 Cathleen Corti ([id number]) Glo Herring, Kerrie Buffalo ([id number]) -------------------------------------------------------------------------------- Vitals Details Christine [name] Date of Service: 04/17/2015 8:00 AM [name] Name: Christine [name] Account Number: [id number] Medical Record Treating RN: Baruch Gouty RN, BSN, Velva Harman [id number] Number: Other Clinician: Date of Birth/Sex: 05-30-1932 (82 y.o. Female) Treating ROBSON, MICHAEL Primary Care Physician/Extender: Delorise Royals, John Physician: Referring Physician: Olga Millers in Treatment: 2 Vital Signs Time Taken: 08:05 Temperature (F): 97.7 Height (in): 65 Pulse (bpm): 61 Weight (lbs): 219 Respiratory Rate (breaths/min): 18 Body Mass Index (BMI): 36.4 Blood Pressure (mmHg): 129/82 Reference Range: 80 - 120 mg / dl Electronic Signature(s) Signed: 04/17/2015 4:43:54 PM By: Regan Lemming BSN, RN Entered By: Regan Lemming on 04/17/2015 08:05:49

## 2015-04-18 NOTE — Progress Notes (Signed)
ANETH, LOEFFLER ([id number]) Visit Report for 04/17/2015 Chief Complaint Document Details LAREINE, AUGUSTIN Date of Service: 04/17/2015 8:00 AM Patient Name: A. Patient Account Number: [id number] Medical Record Treating RN: Baruch Gouty RN, BSN, Velva Harman [id number] Number: Other Clinician: Date of Birth/Sex: 1932/02/12 (82 y.o. Female) Treating Minetta Krisher Primary Care Physician/Extender: Delorise Royals, John Physician: Referring Physician: Olga Millers in Treatment: 2 Information Obtained from: Patient Chief Complaint Patient arrives for review of a necrotic wound on the dorsal aspect of her left in the index finger Electronic Signature(s) Signed: 04/18/2015 7:58:31 AM By: Linton Ham MD Entered By: Linton Ham on 04/17/2015 08:32:52 Cathleen Corti ([id number]) -------------------------------------------------------------------------------- Debridement Details [name] Date of Service: 04/17/2015 8:00 AM Patient Name: A. Patient Account Number: [id number] Medical Record Treating RN: Baruch Gouty RN, BSN, Velva Harman [id number] Number: Other Clinician: Date of Birth/Sex: [birth year] (82 y.o. Female) Treating Ruthmary Occhipinti Primary Care Physician/Extender: Delorise Royals, John Physician: Referring Physician: Olga Millers in Treatment: 2 Debridement Performed for Wound #1 Left,Dorsal Hand - 1st Digit Assessment: Performed By: Physician Ricard Dillon, MD Debridement: Debridement Pre-procedure Yes Verification/Time Out Taken: Start Time: 08:15 Pain Control: Lidocaine 4% Topical Solution Level: Skin/Subcutaneous Tissue Total Area Debrided (L x 0.5 (cm) x 1 (cm) = 0.5 (cm) W): Tissue and other Non-Viable, Fibrin/Slough, Subcutaneous material debrided: Instrument: Curette Bleeding: Minimum Hemostasis Achieved: Pressure End Time: 08:18 Procedural Pain: 0 Post Procedural Pain: 0 Response to Treatment: Procedure was tolerated well Post Debridement  Measurements of Total Wound Length: (cm) 0.5 Width: (cm) 1 Depth: (cm) 0.1 Volume: (cm) 0.039 Post Procedure Diagnosis Same as Pre-procedure Electronic Signature(s) Signed: 04/17/2015 4:43:54 PM By: Regan Lemming BSN, RN Signed: 04/18/2015 7:58:31 AM By: Linton Ham MD Entered By: Linton Ham on 04/17/2015 08:32:40 Zufall, Fantashia AMarland Kitchen ([id number]) Durrell, Kalya A. ([id number]) -------------------------------------------------------------------------------- HPI Details [name] Date of Service: 04/17/2015 8:00 AM Patient Name: A. Patient Account Number: [id number] Medical Record Treating RN: Baruch Gouty RN, BSN, Velva Harman [id number] Number: Other Clinician: Date of Birth/Sex: [birth year] (82 y.o. Female) Treating Dellia Nims, Cyleigh Massaro Primary Care Physician/Extender: Delorise Royals, John Physician: Referring Physician: Olga Millers in Treatment: 2 History of Present Illness HPI Description: 04/03/15 Mrs. Matlick is a pleasant independent 82 year old woman who arrives today accompanied by her daughter. She tells me that roughly 2 weeks ago she noted swelling of the distal part of her left index finger. She denies any specific injury but in particular she denies any form of bite injury either herself or pets. She had no trauma to the finger that she knows of. In any case the swelling became worse and the pain became more severe. Initially she hid this from her daughter but eventually showed it to her. She ended up being admitted to [facility] on 3/11 she had a felon of her finger along with cellulitis. The infected area was decompressed and her nail bed was removed due to infection. The wound was irrigated and saline dressing was applied.. The culture of this grew methicillin sensitive staph aureus. She was ultimately discharged on Keflex which she completed one to 2 days ago. She saw her primary physician at the current normal clinic on 3/15. He noted  prior granulation tissue. Use silver nitrate and use Xeroform. 04/10/15; the patient arrives having taken 1 week of doxycycline. Deep culture of the wound that I did last week was negative which I found somewhat surprising.. She has been using Aquacel Ag to the wound area changing daily 04/18/15; the patient  is taken another week of doxycycline which makes 2 weeks. Deep culture of the original substantial wound on the dorsal aspect of her left index finger was surprisingly negative. In spite of this the wound has been healing nicely. Electronic Signature(s) Signed: 04/18/2015 7:58:31 AM By: Linton Ham MD Entered By: Linton Ham on 04/17/2015 08:33:42 Cathleen Corti ([id number]) -------------------------------------------------------------------------------- Physical Exam Details [name] Date of Service: 04/17/2015 8:00 AM Patient Name: A. Patient Account Number: [id number] Medical Record Treating RN: Baruch Gouty RN, BSN, Velva Harman [id number] Number: Other Clinician: Date of Birth/Sex: Sep 25, 1932 (82 y.o. Female) Treating Orel Cooler Primary Care Physician/Extender: Delorise Royals, John Physician: Referring Physician: Olga Millers in Treatment: 2 Constitutional Temperature is normal and within the target range for the patient.. Lymphatic Not palpable in the epitrochlear or axillary areas. Musculoskeletal The patient has no tenderness in her fourth metacarpal phalangeal joint or the fourth PIP joint. She does not have real tenderness in the DIP joint either. There is ligamentous distruction around this joint. I don't believe there is fluid in the joint currently. Integumentary (Hair, Skin) The wound is over the dorsal aspect of her left fourth DIP. Careful debridement done which reveals all the areas of the wound to be healing. There is no purulence. Notes Wound exam; continued improvement in the area over the distal aspect of the left index finger. Think  this will probably be closed by next week. There is no erythema and no warmth. She has no function of the DIP or PIP joint. This may be chronic and unrelated to the infection nevertheless I'm concerned that she had a septic arthritis and the DIP joint Electronic Signature(s) Signed: 04/18/2015 7:58:31 AM By: Linton Ham MD Entered By: Linton Ham on 04/17/2015 08:37:18 Cathleen Corti ([id number]) -------------------------------------------------------------------------------- Physician Orders Details [name] Date of Service: 04/17/2015 8:00 AM Patient Name: A. Patient Account Number: [id number] Medical Record Treating RN: Baruch Gouty RN, BSN, Velva Harman [id number] Number: Other Clinician: Date of Birth/Sex: 12-Jul-1932 (82 y.o. Female) Treating Deaysia Grigoryan Primary Care Physician/Extender: Delorise Royals, John Physician: Referring Physician: Olga Millers in Treatment: 2 Verbal / Phone Orders: Yes Clinician: Afful, RN, BSN, Rita Read Back and Verified: Yes Diagnosis Coding Wound Cleansing Wound #1 Left,Dorsal Hand - 1st Digit o Cleanse wound with mild soap and water o May Shower, gently pat wound dry prior to applying new dressing. Anesthetic Wound #1 Left,Dorsal Hand - 1st Digit o Topical Lidocaine 4% cream applied to wound bed prior to debridement Primary Wound Dressing Wound #1 Left,Dorsal Hand - 1st Digit o Aquacel Ag Secondary Dressing Wound #1 Left,Dorsal Hand - 1st Digit o Gauze and Kerlix/Conform Dressing Change Frequency Wound #1 Left,Dorsal Hand - 1st Digit o Change dressing every other day. Follow-up Appointments Wound #1 Left,Dorsal Hand - 1st Digit o Return Appointment in 1 week. Additional Orders / Instructions Wound #1 Left,Dorsal Hand - 1st Digit o Increase protein intake. o Activity as tolerated Medications-please add to medication list. Wound #1 Left,Dorsal Hand - 1st Digit Drumgoole, Leylah A. ([id number]) o  P.O. Antibiotics - Continue Doxy 100 mg BID x 7 days Electronic Signature(s) Signed: 04/17/2015 4:43:54 PM By: Regan Lemming BSN, RN Signed: 04/18/2015 7:58:31 AM By: Linton Ham MD Entered By: Regan Lemming on 04/17/2015 08:17:09 REYNALDO, VOLTAIRE AMarland Kitchen ([id number]) -------------------------------------------------------------------------------- Prescription 04/17/2015 Patient Name: [name] A. Physician: Ricard Dillon MD Date of Birth: 1932-05-23 NPI#: [id number] Sex: F DEA#: [id number] Phone #: [phone] License #: [id number] Patient Address: [facility] [place], [place] [address] [facility] [address] [place], [place] [address] [phone] Allergies ACE Inhibitors Cardizem Dyazide Norvasc Penicillins Sulfa (Sulfonamide Antibiotics) Physician's Orders P.O. Antibiotics - Continue Doxy 100 mg BID x 7 days Signature(s): Date(s): Electronic Signature(s) WYVONNE, HOLDER ([id number]) Signed: 04/18/2015 7:58:31 AM By: Linton Ham MD Entered By: Linton Ham on 04/17/2015 08:39:55 Storrs, Kerrie Buffalo ([id number]) --------------------------------------------------------------------------------  Problem List Details [name] Date of Service: 04/17/2015 8:00 AM Patient Name: A. Patient Account Number: [id number] Medical Record Treating RN: Baruch Gouty RN, BSN, Velva Harman [id number] Number: Other Clinician: Date of Birth/Sex: Nov 11, 1932 (82 y.o. Female) Treating Printice Hellmer Primary Care Physician/Extender: Delorise Royals, John Physician: Referring Physician: Olga Millers in Treatment: 2 Active Problems ICD-10 Encounter Code Description Active Date Diagnosis S61.300A Unspecified open wound of right index finger with damage 04/03/2015 Yes to nail, initial encounter L03.114 Cellulitis of left upper limb 04/03/2015 Yes Inactive Problems Resolved Problems Electronic  Signature(s) Signed: 04/18/2015 7:58:31 AM By: Linton Ham MD Entered By: Linton Ham on 04/17/2015 08:32:20 Cathleen Corti ([id number]) -------------------------------------------------------------------------------- Progress Note Details [name] Date of Service: 04/17/2015 8:00 AM Patient Name: A. Patient Account Number: [id number] Medical Record Treating RN: Baruch Gouty RN, BSN, Velva Harman [id number] Number: Other Clinician: Date of Birth/Sex: 08-05-1932 (82 y.o. Female) Treating Deby Adger Primary Care Physician/Extender: Delorise Royals, John Physician: Referring Physician: Olga Millers in Treatment: 2 Subjective Chief Complaint Information obtained from Patient Patient arrives for review of a necrotic wound on the dorsal aspect of her left in the index finger History of Present Illness (HPI) 04/03/15 Mrs. Gilpatrick is a pleasant independent 82 year old woman who arrives today accompanied by her daughter. She tells me that roughly 2 weeks ago she noted swelling of the distal part of her left index finger. She denies any specific injury but in particular she denies any form of bite injury either herself or pets. She had no trauma to the finger that she knows of. In any case the swelling became worse and the pain became more severe. Initially she hid this from her daughter but eventually showed it to her. She ended up being admitted to [facility] on 3/11 she had a felon of her finger along with cellulitis. The infected area was decompressed and her nail bed was removed due to infection. The wound was irrigated and saline dressing was applied.. The culture of this grew methicillin sensitive staph aureus. She was ultimately discharged on Keflex which she completed one to 2 days ago. She saw her primary physician at the current normal clinic on 3/15. He noted prior granulation tissue. Use silver nitrate and use Xeroform. 04/10/15; the patient  arrives having taken 1 week of doxycycline. Deep culture of the wound that I did last week was negative which I found somewhat surprising.. She has been using Aquacel Ag to the wound area changing daily 04/18/15; the patient is taken another week of doxycycline which makes 2 weeks. Deep culture of the original substantial wound on the dorsal aspect of her left index finger was surprisingly negative. In spite of this the wound has been healing nicely. Objective Constitutional Temperature is normal and within the target range for the patient.. Vitals Time Taken: 8:05 AM, Height: 65 in, Weight: 219 lbs, BMI: 36.4, Temperature: 97.7 F, Pulse: 61 Gavel, Teosha A. ([id number]) bpm, Respiratory Rate: 18 breaths/min, Blood Pressure: 129/82 mmHg. Lymphatic Not palpable in the epitrochlear or axillary areas. Musculoskeletal The patient has no  tenderness in her fourth metacarpal phalangeal joint or the fourth PIP joint. She does not have real tenderness in the DIP joint either. There is ligamentous distruction around this joint. I don't believe there is fluid in the joint currently. General Notes: Wound exam; continued improvement in the area over the distal aspect of the left index finger. Think this will probably be closed by next week. There is no erythema and no warmth. She has no function of the DIP or PIP joint. This may be chronic and unrelated to the infection nevertheless I'm concerned that she had a septic arthritis and the DIP joint Integumentary (Hair, Skin) The wound is over the dorsal aspect of her left fourth DIP. Careful debridement done which reveals all the areas of the wound to be healing. There is no purulence. Wound #1 status is Open. Original cause of wound was Gradually Appeared. The wound is located on the Left,Dorsal Hand - 1st Digit. The wound measures 0.5cm length x 1cm width x 0.1cm depth; 0.393cm^2 area and 0.039cm^3 volume. The wound is limited to skin breakdown.  There is no tunneling or undermining noted. There is a small amount of serosanguineous drainage noted. The wound margin is distinct with the outline attached to the wound base. There is large (67-100%) pink granulation within the wound bed. There is a small (1-33%) amount of necrotic tissue within the wound bed including Adherent Slough. The periwound skin appearance exhibited: Moist. The periwound skin appearance did not exhibit: Callus, Crepitus, Excoriation, Fluctuance, Friable, Induration, Localized Edema, Rash, Scarring, Dry/Scaly, Maceration, Atrophie Blanche, Cyanosis, Ecchymosis, Hemosiderin Staining, Mottled, Pallor, Rubor, Erythema. Periwound temperature was noted as No Abnormality. The periwound has tenderness on palpation. Assessment Active Problems ICD-10 S61.300A - Unspecified open wound of right index finger with damage to nail, initial encounter L03.114 - Cellulitis of left upper limb Procedures Guess, Jovi A. ([id number]) Wound #1 Wound #1 is an Abscess located on the Left,Dorsal Hand - 1st Digit . There was a Skin/Subcutaneous Tissue Debridement [id number]) debridement with total area of 0.5 sq cm performed by Ricard Dillon, MD. with the following instrument(s): Curette to remove Non-Viable tissue/material including Fibrin/Slough and Subcutaneous after achieving pain control using Lidocaine 4% Topical Solution. A time out was conducted prior to the start of the procedure. A Minimum amount of bleeding was controlled with Pressure. The procedure was tolerated well with a pain level of 0 throughout and a pain level of 0 following the procedure. Post Debridement Measurements: 0.5cm length x 1cm width x 0.1cm depth; 0.039cm^3 volume. Post procedure Diagnosis Wound #1: Same as Pre-Procedure Plan Wound Cleansing: Wound #1 Left,Dorsal Hand - 1st Digit: Cleanse wound with mild soap and water May Shower, gently pat wound dry prior to applying new  dressing. Anesthetic: Wound #1 Left,Dorsal Hand - 1st Digit: Topical Lidocaine 4% cream applied to wound bed prior to debridement Primary Wound Dressing: Wound #1 Left,Dorsal Hand - 1st Digit: Aquacel Ag Secondary Dressing: Wound #1 Left,Dorsal Hand - 1st Digit: Gauze and Kerlix/Conform Dressing Change Frequency: Wound #1 Left,Dorsal Hand - 1st Digit: Change dressing every other day. Follow-up Appointments: Wound #1 Left,Dorsal Hand - 1st Digit: Return Appointment in 1 week. Additional Orders / Instructions: Wound #1 Left,Dorsal Hand - 1st Digit: Increase protein intake. Activity as tolerated Medications-please add to medication list.: Wound #1 Left,Dorsal Hand - 1st Digit: P.O. Antibiotics - Continue Doxy 100 mg BID x 7 days Mauss, Enijah A. ([id number]) #1 the patient remains well with no complaints of pain or systemic symptoms  2 I've renewed her doxycycline for a further 2 weeks concerned that this infection initially involved the DIP joint 3 no change to the Aquacel Ag that we have been dressing this wound with and a Kerlix wrap. I think this entire area will be closed by next week 4 I do not really know the function of this lady's fourth finger before the actual infection however it is very limited now Electronic Signature(s) Signed: 04/18/2015 7:58:31 AM By: Linton Ham MD Entered By: Linton Ham on 04/17/2015 08:39:27 Cathleen Corti ([id number]) -------------------------------------------------------------------------------- SuperBill Details [name] Date of Service: 04/17/2015 Patient Name: A. Patient Account Number: [id number] Medical Record Treating RN: Baruch Gouty RN, BSN, Velva Harman [id number] Number: Other Clinician: Date of Birth/Sex: 1932/09/03 (82 y.o. Female) Treating Omauri Boeve Primary Care Physician/Extender: Delorise Royals John Physician: Suella Grove in Treatment: 2 Referring Physician: Lynett Fish Diagnosis Coding ICD-10 Codes Code  Description S61.300A Unspecified open wound of right index finger with damage to nail, initial encounter L03.114 Cellulitis of left upper limb Facility Procedures CPT4: Description Modifier Quantity Code [id number] 11042 - DEB SUBQ TISSUE 20 SQ CM/< 1 ICD-10 Description Diagnosis S61.300A Unspecified open wound of right index finger with damage to nail, initial encounter Physician Procedures CPT4: Description Modifier Quantity Code [id number] 11042 - WC PHYS SUBQ TISS 20 SQ CM 1 ICD-10 Description Diagnosis S61.300A Unspecified open wound of right index finger with damage to nail, initial encounter Electronic Signature(s) Signed: 04/18/2015 7:58:31 AM By: Linton Ham MD Entered By: Linton Ham on 04/17/2015 08:39:47

## 2015-04-24 ENCOUNTER — Encounter: Payer: Medicare Other | Admitting: Internal Medicine

## 2015-04-24 DIAGNOSIS — S61300A Unspecified open wound of right index finger with damage to nail, initial encounter: Secondary | ICD-10-CM | POA: Diagnosis not present

## 2015-04-24 NOTE — Progress Notes (Signed)
AASHNI, LUFF ([id number]) Visit Report for 04/24/2015 Chief Complaint Document Details Christine [name], Christine [name] Date of Service: 04/24/2015 8:00 AM Patient Name: [name]. Patient Account Number: [id number] Medical Record Treating RN: Baruch Gouty RN, BSN, Velva Harman [id number] Number: Other Clinician: Date of Birth/Sex: [birth year] (82 y.o. Female) Treating ROBSON, MICHAEL Primary Care Physician/Extender: Delorise Royals, John Physician: Referring Physician: Olga Millers in Treatment: 3 Information Obtained from: Patient Chief Complaint Patient arrives for review of [name] necrotic wound on the dorsal aspect of her left in the index finger Electronic Signature(s) Signed: 04/24/2015 3:36:31 PM By: Linton Ham MD Entered By: Linton Ham on 04/24/2015 08:31:18 Christine [name] ([id number]) -------------------------------------------------------------------------------- Debridement Details Christine [name] Date of Service: 04/24/2015 8:00 AM Patient Name: [name]. Patient Account Number: [id number] Medical Record Treating RN: Baruch Gouty RN, BSN, Velva Harman [id number] Number: Other Clinician: Date of Birth/Sex: 05-20-1932 (82 y.o. Female) Treating ROBSON, MICHAEL Primary Care Physician/Extender: Delorise Royals, John Physician: Referring Physician: Olga Millers in Treatment: 3 Debridement Performed for Wound #1 Left,Dorsal Hand - 1st Digit Assessment: Performed By: Physician Ricard Dillon, MD Debridement: Debridement Pre-procedure Yes Verification/Time Out Taken: Start Time: 08:20 Pain Control: Lidocaine 4% Topical Solution Level: Skin/Subcutaneous Tissue Total Area Debrided (L x 0.2 (cm) x 0.5 (cm) = 0.1 (cm) W): Tissue and other Non-Viable, Fibrin/Slough, Subcutaneous material debrided: Instrument: Curette Bleeding: Minimum Hemostasis Achieved: Pressure End Time: 08:25 Procedural Pain: 0 Post Procedural Pain: 0 Response to Treatment: Procedure was tolerated well Post  Debridement Measurements of Total Wound Length: (cm) 0.2 Width: (cm) 0.5 Depth: (cm) 0.2 Volume: (cm) 0.016 Post Procedure Diagnosis Same as Pre-procedure Electronic Signature(s) Signed: 04/24/2015 3:36:31 PM By: Linton Ham MD Signed: 04/24/2015 4:07:00 PM By: Regan Lemming BSN, RN Entered By: Linton Ham on 04/24/2015 08:31:07 Christine [name] ([id number]) Christine [name], Christine [name]. ([id number]) -------------------------------------------------------------------------------- HPI Details Christine [name] Date of Service: 04/24/2015 8:00 AM Patient Name: [name]. Patient Account Number: [id number] Medical Record Treating RN: Baruch Gouty RN, BSN, Velva Harman [id number] Number: Other Clinician: Date of Birth/Sex: [birth year] (82 y.o. Female) Treating Dellia Nims, MICHAEL Primary Care Physician/Extender: Delorise Royals, John Physician: Referring Physician: Olga Millers in Treatment: 3 History of Present Illness HPI Description: 04/03/15 [name] is [name] pleasant independent 82 year old woman who arrives today accompanied by her daughter. She tells me that roughly 2 weeks ago she noted swelling of the distal part of her left index finger. She denies any specific injury but in particular she denies any form of bite injury either herself or pets. She had no trauma to the finger that she knows of. In any case the swelling became worse and the pain became more severe. Initially she hid this from her daughter but eventually showed it to her. She ended up being admitted to [facility] on 3/11 she had [name] felon of her finger along with cellulitis. The infected area was decompressed and her nail bed was removed due to infection. The wound was irrigated and saline dressing was applied.. The culture of this grew methicillin sensitive staph aureus. She was ultimately discharged on Keflex which she completed one to 2 days ago. She saw her primary physician at the current normal clinic on  3/15. He noted prior granulation tissue. Use silver nitrate and use Xeroform. 04/10/15; the patient arrives having taken 1 week of doxycycline. Deep culture of the wound that I did last week was negative which I found somewhat surprising.. She has been using Aquacel Ag to the wound area changing daily 04/18/15; the patient  is taken another week of doxycycline which makes 2 weeks. Deep culture of the original substantial wound on the dorsal aspect of her left index finger was surprisingly negative. In spite of this the wound has been healing nicely. 04/24/15; patient is completed her third week of doxycycline and has another week to go after this. She apparently got the dressing wet.. She reports no pain, swelling, drainage most of her pain is in her right hand Electronic Signature(s) Signed: 04/24/2015 3:36:31 PM By: Linton Ham MD Entered By: Linton Ham on 04/24/2015 08:32:44 Christine [name] ([id number]) -------------------------------------------------------------------------------- Physical Exam Details Christine [name] Date of Service: 04/24/2015 8:00 AM Patient Name: [name]. Patient Account Number: [id number] Medical Record Treating RN: Baruch Gouty RN, BSN, Velva Harman [id number] Number: Other Clinician: Date of Birth/Sex: Aug 24, 1932 (82 y.o. Female) Treating ROBSON, MICHAEL Primary Care Physician/Extender: Delorise Royals, John Physician: Referring Physician: Olga Millers in Treatment: 3 Constitutional Sitting or standing Blood Pressure is within target range for patient.. Pulse regular and within target range for patient.. Temperature is normal and within the target range for the patient.. Musculoskeletal She has significant enlargement of the left first PIP but no effusion. Her DIP has no effusion that I can elicit and no tenderness. There is ligamentous distruction around this joint.. Notes Wound exam; had some scant surface eschar that is remove the. She has small open areas  remaining however these appear to be clean and I don't suspect there to be any difficulty getting these to close over. Careful examination of the DIP joint does not reveal any tenderness and I don't believe there is an effusion. Electronic Signature(s) Signed: 04/24/2015 3:36:31 PM By: Linton Ham MD Entered By: Linton Ham on 04/24/2015 08:34:50 Christine [name] ([id number]) -------------------------------------------------------------------------------- Physician Orders Details Christine [name] Date of Service: 04/24/2015 8:00 AM Patient Name: [name]. Patient Account Number: [id number] Medical Record Treating RN: Baruch Gouty RN, BSN, Velva Harman [id number] Number: Other Clinician: Date of Birth/Sex: [birth year] (82 y.o. Female) Treating ROBSON, MICHAEL Primary Care Physician/Extender: Delorise Royals, John Physician: Referring Physician: Olga Millers in Treatment: 3 Verbal / Phone Orders: Yes Clinician: Afful, RN, BSN, Rita Read Back and Verified: Yes Diagnosis Coding Wound Cleansing Wound #1 Left,Dorsal Hand - 1st Digit o Cleanse wound with mild soap and water o May Shower, gently pat wound dry prior to applying new dressing. Anesthetic Wound #1 Left,Dorsal Hand - 1st Digit o Topical Lidocaine 4% cream applied to wound bed prior to debridement Primary Wound Dressing Wound #1 Left,Dorsal Hand - 1st Digit o Aquacel Ag Secondary Dressing Wound #1 Left,Dorsal Hand - 1st Digit o Gauze and Kerlix/Conform Dressing Change Frequency Wound #1 Left,Dorsal Hand - 1st Digit o Change dressing every other day. Follow-up Appointments Wound #1 Left,Dorsal Hand - 1st Digit o Return Appointment in 1 week. Additional Orders / Instructions Wound #1 Left,Dorsal Hand - 1st Digit o Increase protein intake. o Activity as tolerated Medications-please add to medication list. Wound #1 Left,Dorsal Hand - 1st Digit Christine [name], Christine [name]. ([id number]) o P.O. Antibiotics -  Continue Doxy 100 mg BID x 7 days Electronic Signature(s) Signed: 04/24/2015 3:36:31 PM By: Linton Ham MD Signed: 04/24/2015 4:07:00 PM By: Regan Lemming BSN, RN Entered By: Regan Lemming on 04/24/2015 08:24:39 Christine [name], Christine AMarland Kitchen ([id number]) -------------------------------------------------------------------------------- Prescription 04/24/2015 Patient Name: Christine [name] [name]. Physician: Ricard Dillon MD Date of Birth: [birth year] NPI#: [id number] Sex: F DEA#: [id number] Phone #: [phone] License #: [id number] Patient Address: [place] [facility] [place] [address] [place], [place]  [facility] [address] [place], [place] [address] [phone] Allergies ACE Inhibitors Cardizem Dyazide Norvasc Penicillins Sulfa (Sulfonamide Antibiotics) Physician's Orders P.O. Antibiotics - Continue Doxy 100 mg BID x 7 days Signature(s): Date(s): Electronic Signature(s) JAXI, LIPPE ([id number]) Signed: 04/24/2015 3:36:31 PM By: Linton Ham MD Entered By: Linton Ham on 04/24/2015 08:36:53 Whitelock, Kerrie Buffalo ([id number]) --------------------------------------------------------------------------------  Problem List Details Christine [name] Date of Service: 04/24/2015 8:00 AM Patient Name: [name]. Patient Account Number: [id number] Medical Record Treating RN: Baruch Gouty RN, BSN, Velva Harman [id number] Number: Other Clinician: Date of Birth/Sex: Jun 21, 1932 (82 y.o. Female) Treating ROBSON, MICHAEL Primary Care Physician/Extender: Delorise Royals, John Physician: Referring Physician: Olga Millers in Treatment: 3 Active Problems ICD-10 Encounter Code Description Active Date Diagnosis S61.300A Unspecified open wound of right index finger with damage 04/03/2015 Yes to nail, initial encounter L03.114 Cellulitis of left upper limb 04/03/2015 Yes Inactive Problems Resolved Problems Electronic  Signature(s) Signed: 04/24/2015 3:36:31 PM By: Linton Ham MD Entered By: Linton Ham on 04/24/2015 08:30:55 Kroboth, Kerrie Buffalo ([id number]) -------------------------------------------------------------------------------- Progress Note Details Christine [name] Date of Service: 04/24/2015 8:00 AM Patient Name: [name]. Patient Account Number: [id number] Medical Record Treating RN: Baruch Gouty RN, BSN, Velva Harman [id number] Number: Other Clinician: Date of Birth/Sex: 04/23/1932 (82 y.o. Female) Treating ROBSON, MICHAEL Primary Care Physician/Extender: Delorise Royals, John Physician: Referring Physician: Olga Millers in Treatment: 3 Subjective Chief Complaint Information obtained from Patient Patient arrives for review of [name] necrotic wound on the dorsal aspect of her left in the index finger History of Present Illness (HPI) 04/03/15 Mrs. Marke is [name] pleasant independent 82 year old woman who arrives today accompanied by her daughter. She tells me that roughly 2 weeks ago she noted swelling of the distal part of her left index finger. She denies any specific injury but in particular she denies any form of bite injury either herself or pets. She had no trauma to the finger that she knows of. In any case the swelling became worse and the pain became more severe. Initially she hid this from her daughter but eventually showed it to her. She ended up being admitted to [facility] on 3/11 she had [name] felon of her finger along with cellulitis. The infected area was decompressed and her nail bed was removed due to infection. The wound was irrigated and saline dressing was applied.. The culture of this grew methicillin sensitive staph aureus. She was ultimately discharged on Keflex which she completed one to 2 days ago. She saw her primary physician at the current normal clinic on 3/15. He noted prior granulation tissue. Use silver nitrate and use Xeroform. 04/10/15; the patient  arrives having taken 1 week of doxycycline. Deep culture of the wound that I did last week was negative which I found somewhat surprising.. She has been using Aquacel Ag to the wound area changing daily 04/18/15; the patient is taken another week of doxycycline which makes 2 weeks. Deep culture of the original substantial wound on the dorsal aspect of her left index finger was surprisingly negative. In spite of this the wound has been healing nicely. 04/24/15; patient is completed her third week of doxycycline and has another week to go after this. She apparently got the dressing wet.. She reports no pain, swelling, drainage most of her pain is in her right hand Objective Constitutional Sitting or standing Blood Pressure is within target range for patient.. Pulse regular and within target range [name], Christine [name]. ([id number]) for patient.. Temperature  is normal and within the target range for the patient.. Vitals Time Taken: 8:07 AM, Height: 65 in, Weight: 219 lbs, BMI: 36.4, Temperature: 98.1 F, Pulse: 61 bpm, Respiratory Rate: 18 breaths/min, Blood Pressure: 123/58 mmHg. Musculoskeletal She has significant enlargement of the left first PIP but no effusion. Her DIP has no effusion that I can elicit and no tenderness. There is ligamentous distruction around this joint.. General Notes: Wound exam; had some scant surface eschar that is remove the. She has small open areas remaining however these appear to be clean and I don't suspect there to be any difficulty getting these to close over. Careful examination of the DIP joint does not reveal any tenderness and I don't believe there is an effusion. Integumentary (Hair, Skin) Wound #1 status is Open. Original cause of wound was Gradually Appeared. The wound is located on the Left,Dorsal Hand - 1st Digit. The wound measures 0.2cm length x 0.5cm width x 0.1cm depth; 0.079cm^2 area and 0.008cm^3 volume. The wound is limited to skin breakdown. There  is no tunneling or undermining noted. There is [name] small amount of serosanguineous drainage noted. The wound margin is distinct with the outline attached to the wound base. There is large (67-100%) pink granulation within the wound bed. There is [name] small (1-33%) amount of necrotic tissue within the wound bed including Adherent Slough. The periwound skin appearance exhibited: Moist. The periwound skin appearance did not exhibit: Callus, Crepitus, Excoriation, Fluctuance, Friable, Induration, Localized Edema, Rash, Scarring, Dry/Scaly, Maceration, Atrophie Blanche, Cyanosis, Ecchymosis, Hemosiderin Staining, Mottled, Pallor, Rubor, Erythema. Periwound temperature was noted as No Abnormality. The periwound has tenderness on palpation. Assessment Active Problems ICD-10 S61.300A - Unspecified open wound of right index finger with damage to nail, initial encounter L03.114 - Cellulitis of left upper limb Procedures Wound #1 Wound #1 is an Abscess located on the Left,Dorsal Hand - 1st Digit . There was [name] Skin/Subcutaneous Tissue Debridement [id number]) debridement with total area of 0.1 sq cm performed by Tommie Ard, Christine [name]. ([id number]) MICHAEL G, MD. with the following instrument(s): Curette to remove Non-Viable tissue/material including Fibrin/Slough and Subcutaneous after achieving pain control using Lidocaine 4% Topical Solution. [name] time out was conducted prior to the start of the procedure. [name] Minimum amount of bleeding was controlled with Pressure. The procedure was tolerated well with [name] pain level of 0 throughout and [name] pain level of 0 following the procedure. Post Debridement Measurements: 0.2cm length x 0.5cm width x 0.2cm depth; 0.016cm^3 volume. Post procedure Diagnosis Wound #1: Same as Pre-Procedure Plan Wound Cleansing: Wound #1 Left,Dorsal Hand - 1st Digit: Cleanse wound with mild soap and water May Shower, gently pat wound dry prior to applying new  dressing. Anesthetic: Wound #1 Left,Dorsal Hand - 1st Digit: Topical Lidocaine 4% cream applied to wound bed prior to debridement Primary Wound Dressing: Wound #1 Left,Dorsal Hand - 1st Digit: Aquacel Ag Secondary Dressing: Wound #1 Left,Dorsal Hand - 1st Digit: Gauze and Kerlix/Conform Dressing Change Frequency: Wound #1 Left,Dorsal Hand - 1st Digit: Change dressing every other day. Follow-up Appointments: Wound #1 Left,Dorsal Hand - 1st Digit: Return Appointment in 1 week. Additional Orders / Instructions: Wound #1 Left,Dorsal Hand - 1st Digit: Increase protein intake. Activity as tolerated Medications-please add to medication list.: Wound #1 Left,Dorsal Hand - 1st Digit: P.O. Antibiotics - Continue Doxy 100 mg BID x 7 days #1 things continue to improve here using Aquacel Ag Kerlix #2 she has another week of doxycycline which will give her [name] month of antibiotics Christine [name],  Christine [name]. ([id number]) #3 I'm hopeful she'll be able to be discharged next week, she will need to follow up on this finger especially the DIP joint I spent some time talking to the patient and her daughter Electronic Signature(s) Signed: 04/24/2015 3:36:31 PM By: Linton Ham MD Entered By: Linton Ham on 04/24/2015 08:36:20 Christine [name] ([id number]) -------------------------------------------------------------------------------- SuperBill Details Christine [name] Date of Service: 04/24/2015 Patient Name: [name]. Patient Account Number: [id number] Medical Record Treating RN: Baruch Gouty RN, BSN, Velva Harman [id number] Number: Other Clinician: Date of Birth/Sex: [birth year] (82 y.o. Female) Treating ROBSON, MICHAEL Primary Care Physician/Extender: Delorise Royals John Physician: Suella Grove in Treatment: 3 Referring Physician: Lynett Fish Diagnosis Coding ICD-10 Codes Code Description S61.300A Unspecified open wound of right index finger with damage to nail, initial encounter L03.114 Cellulitis of left  upper limb Facility Procedures CPT4: Description Modifier Quantity Code [id number] 11042 - DEB SUBQ TISSUE 20 SQ CM/< 1 ICD-10 Description Diagnosis S61.300A Unspecified open wound of right index finger with damage to nail, initial encounter Physician Procedures CPT4: Description Modifier Quantity Code [id number] 11042 - WC PHYS SUBQ TISS 20 SQ CM 1 ICD-10 Description Diagnosis S61.300A Unspecified open wound of right index finger with damage to nail, initial encounter Electronic Signature(s) Signed: 04/24/2015 3:36:31 PM By: Linton Ham MD Entered By: Linton Ham on 04/24/2015 08:36:44

## 2015-04-24 NOTE — Progress Notes (Signed)
Christine [name] ([id number]) Visit Report for 04/24/2015 Arrival Information Details Christine [name] Date of Service: 04/24/2015 8:00 AM Patient Name: A. Patient Account Number: [id number] Medical Record Treating RN: Baruch Gouty RN, BSN, Velva Harman [id number] Number: Other Clinician: Date of Birth/Sex: 08/25/1932 (82 y.o. Female) Treating Dellia Nims, [name] Primary Care Physician/Extender: Christine [name] Physician: Referring Physician: Olga [name] in Treatment: 3 Visit Information History Since Last Visit Added or deleted any medications: No Patient Arrived: Ambulatory Any new allergies or adverse reactions: No Arrival Time: 08:05 Had a fall or experienced change in No Accompanied By: dtr activities of daily living that may affect Transfer Assistance: None risk of falls: Patient Identification Verified: Yes Signs or symptoms of abuse/neglect since last No Secondary Verification Process Yes visito Completed: Hospitalized since last visit: No Patient Requires Transmission-Based No Has Dressing in Place as Prescribed: Yes Precautions: Pain Present Now: No Patient Has Alerts: No Electronic Signature(s) Signed: 04/24/2015 4:07:00 PM By: Regan Lemming BSN, RN Entered By: Regan Lemming on 04/24/2015 08:05:41 Christine [name] ([id number]) -------------------------------------------------------------------------------- Encounter Discharge Information Details Christine [name] Date of Service: 04/24/2015 8:00 AM Patient Name: A. Patient Account Number: [id number] Medical Record Treating RN: Baruch Gouty RN, BSN, Velva Harman [id number] Number: Other Clinician: Date of Birth/Sex: [birth year] (82 y.o. Female) Treating ROBSON, [name] Primary Care Physician/Extender: Christine [name] Physician: Referring Physician: Olga [name] in Treatment: 3 Encounter Discharge Information Items Discharge Pain Level: 0 Discharge Condition: Stable Ambulatory Status: Ambulatory Discharge  Destination: Home Transportation: Private Auto Accompanied By: dtr Schedule Follow-up Appointment: No Medication Reconciliation completed and provided to Patient/Care No Ramanda Paules: Provided on Clinical Summary of Care: 04/24/2015 Form Type Recipient Paper Patient MF Electronic Signature(s) Signed: 04/24/2015 8:32:31 AM By: Ruthine Dose Entered By: Ruthine Dose on 04/24/2015 08:32:31 Christine [name], Christine A. ([id number]) -------------------------------------------------------------------------------- Multi Wound Chart Details Christine [name] Date of Service: 04/24/2015 8:00 AM Patient Name: A. Patient Account Number: [id number] Medical Record Treating RN: Baruch Gouty RN, BSN, Velva Harman [id number] Number: Other Clinician: Date of Birth/Sex: November 08, 1932 (82 y.o. Female) Treating ROBSON, [name] Primary Care Physician/Extender: Christine [name] Physician: Referring Physician: Olga [name] in Treatment: 3 Vital Signs Height(in): 65 Pulse(bpm): 61 Weight(lbs): 219 Blood Pressure 123/58 (mmHg): Body Mass Index(BMI): 36 Temperature(F): 98.1 Respiratory Rate 18 (breaths/min): Photos: [1:No Photos] [N/A:N/A] Wound Location: [1:Left Hand - 1st Digit - Dorsal] [N/A:N/A] Wounding Event: [1:Gradually Appeared] [N/A:N/A] Primary Etiology: [1:Abscess] [N/A:N/A] Comorbid History: [1:Cataracts, Anemia, Arrhythmia, Congestive Heart Failure, Hypertension, Osteoarthritis] [N/A:N/A] Date Acquired: [1:03/20/2015] [N/A:N/A] Weeks of Treatment: [1:3] [N/A:N/A] Wound Status: [1:Open] [N/A:N/A] Measurements L x W x D 0.2x0.5x0.1 [N/A:N/A] (cm) Area (cm) : [1:0.079] [N/A:N/A] Volume (cm) : [1:0.008] [N/A:N/A] % Reduction in Area: [1:98.30%] [N/A:N/A] % Reduction in Volume: 98.30% [N/A:N/A] Classification: [1:Full Thickness Without Exposed Support Structures] [N/A:N/A] Exudate Amount: [1:Small] [N/A:N/A] Exudate Type: [1:Serosanguineous] [N/A:N/A] Exudate Color: [1:red, brown]  [N/A:N/A] Wound Margin: [1:Distinct, outline attached] [N/A:N/A] Granulation Amount: [1:Large (67-100%)] [N/A:N/A] Granulation Quality: Pink N/A N/A Necrotic Amount: Small (1-33%) N/A N/A Exposed Structures: Fascia: No N/A N/A Fat: No Tendon: No Muscle: No Joint: No Bone: No Limited to Skin Breakdown Epithelialization: Large (67-100%) N/A N/A Periwound Skin Texture: Edema: No N/A N/A Excoriation: No Induration: No Callus: No Crepitus: No Fluctuance: No Friable: No Rash: No Scarring: No Periwound Skin Moist: Yes N/A N/A Moisture: Maceration: No Dry/Scaly: No Periwound Skin Color: Atrophie Blanche: No N/A N/A Cyanosis: No Ecchymosis: No Erythema: No Hemosiderin Staining: No Mottled: No Pallor: No Rubor: No Temperature: No Abnormality N/A N/A Tenderness on  Yes N/A N/A Palpation: Wound Preparation: Ulcer Cleansing: N/A N/A Rinsed/Irrigated with Saline Topical Anesthetic Applied: Other: lidocaine 4% Treatment Notes Electronic Signature(s) Signed: 04/24/2015 4:07:00 PM By: Regan Lemming BSN, RN Entered By: Regan Lemming on 04/24/2015 08:23:02 Christine [name] ([id number]) -------------------------------------------------------------------------------- Multi-Disciplinary Care Plan Details Christine [name] Date of Service: 04/24/2015 8:00 AM Patient Name: A. Patient Account Number: [id number] Medical Record Treating RN: Baruch Gouty RN, BSN, Velva Harman [id number] Number: Other Clinician: Date of Birth/Sex: 1932-04-15 (82 y.o. Female) Treating Dellia Nims, [name] Primary Care Physician/Extender: Christine [name] Physician: Referring Physician: Olga [name] in Treatment: 3 Active Inactive Orientation to the Wound Care Program Nursing Diagnoses: Knowledge deficit related to the wound healing center program Goals: Patient/caregiver will verbalize understanding of the [facility] Date Initiated: 04/03/2015 Goal Status:  Active Interventions: Provide education on orientation to the wound center Notes: Wound/Skin Impairment Nursing Diagnoses: Impaired tissue integrity Knowledge deficit related to ulceration/compromised skin integrity Goals: Patient/caregiver will verbalize understanding of skin care regimen Date Initiated: 04/03/2015 Goal Status: Active Ulcer/skin breakdown will have a volume reduction of 30% by week 4 Date Initiated: 04/03/2015 Goal Status: Active Ulcer/skin breakdown will have a volume reduction of 50% by week 8 Date Initiated: 04/03/2015 Goal Status: Active Ulcer/skin breakdown will have a volume reduction of 80% by week 12 Date Initiated: 04/03/2015 Goal Status: Active Christine [name], Christine A. ([id number]) Ulcer/skin breakdown will heal within 14 weeks Date Initiated: 04/03/2015 Goal Status: Active Interventions: Assess patient/caregiver ability to obtain necessary supplies Assess patient/caregiver ability to perform ulcer/skin care regimen upon admission and as needed Assess ulceration(s) every visit Provide education on ulcer and skin care Treatment Activities: Patient referred to home care : 04/24/2015 Referred to DME Nikita Humble for dressing supplies : 04/24/2015 Skin care regimen initiated : 04/24/2015 Topical wound management initiated : 04/24/2015 Notes: Electronic Signature(s) Signed: 04/24/2015 4:07:00 PM By: Regan Lemming BSN, RN Entered By: Regan Lemming on 04/24/2015 08:22:52 Christine [name] A. ([id number]) -------------------------------------------------------------------------------- Pain Assessment Details Christine [name] Date of Service: 04/24/2015 8:00 AM Patient Name: A. Patient Account Number: [id number] Medical Record Treating RN: Baruch Gouty RN, BSN, Velva Harman [id number] Number: Other Clinician: Date of Birth/Sex: Apr 12, 1932 (82 y.o. Female) Treating Dellia Nims, [name] Primary Care Physician/Extender: Christine [name] Physician: Referring Physician: Olga [name] in Treatment: 3 Active Problems Location of Pain Severity and Description of Pain Patient Has Paino No Site Locations Pain Management and Medication Current Pain Management: Electronic Signature(s) Signed: 04/24/2015 4:07:00 PM By: Regan Lemming BSN, RN Entered By: Regan Lemming on 04/24/2015 [id number] Christine [name] ([id number]) -------------------------------------------------------------------------------- Patient/Caregiver Education Details Christine [name] Date of Service: 04/24/2015 8:00 AM Patient Name: A. Patient Account Number: [id number] Medical Record Treating RN: Baruch Gouty RN, BSN, Velva Harman [id number] Number: Other Clinician: Date of Birth/Gender: [birth year] (82 y.o. Female) Treating Dellia Nims, [name] Primary Care Physician/Extender: Christine Royals [name] Physician: Suella Grove in Treatment: 3 Referring Physician: Lynett Fish Education Assessment Education Provided To: Patient and Caregiver Education Topics Provided Welcome To [facility]: Methods: Explain/Verbal Responses: State content correctly Wound/Skin Impairment: Methods: Explain/Verbal Responses: State content correctly Electronic Signature(s) Signed: 04/24/2015 4:07:00 PM By: Regan Lemming BSN, RN Entered By: Regan Lemming on 04/24/2015 08:25:37 Christine [name] ([id number]) -------------------------------------------------------------------------------- Wound Assessment Details Christine [name] Date of Service: 04/24/2015 8:00 AM Patient Name: A. Patient Account Number: [id number] Medical Record Treating RN: Baruch Gouty RN, BSN, Velva Harman [id number] Number: Other Clinician: Date of Birth/Sex: 08-12-1932 (82 y.o. Female) Treating Dellia Nims, [name] Primary Care Physician/Extender: Christine [name] Physician: Referring Physician:  Sarina Ser, [name] Weeks in Treatment: 3 Wound Status Wound Number: 1 Primary Abscess Etiology: Wound Location: Left Hand - 1st Digit - Dorsal Wound Open Wounding Event:  Gradually Appeared Status: Date Acquired: 03/20/2015 Comorbid Cataracts, Anemia, Arrhythmia, Weeks Of Treatment: 3 History: Congestive Heart Failure, Clustered Wound: No Hypertension, Osteoarthritis Photos Photo Uploaded By: Regan Lemming on 04/24/2015 15:27:38 Wound Measurements Length: (cm) 0.2 Width: (cm) 0.5 Depth: (cm) 0.1 Area: (cm) 0.079 Volume: (cm) 0.008 % Reduction in Area: 98.3% % Reduction in Volume: 98.3% Epithelialization: Large (67-100%) Tunneling: No Undermining: No Wound Description Full Thickness Without Exposed Classification: Support Structures Wound Margin: Distinct, outline attached Exudate Small Amount: Exudate Type: Serosanguineous Exudate Color: red, brown Christine [name], Christine A. ([id number]) Foul Odor After Cleansing: No Wound Bed Granulation Amount: Large (67-100%) Exposed Structure Granulation Quality: Pink Fascia Exposed: No Necrotic Amount: Small (1-33%) Fat Layer Exposed: No Necrotic Quality: Adherent Slough Tendon Exposed: No Muscle Exposed: No Joint Exposed: No Bone Exposed: No Limited to Skin Breakdown Periwound Skin Texture Texture Color No Abnormalities Noted: No No Abnormalities Noted: No Callus: No Atrophie Blanche: No Crepitus: No Cyanosis: No Excoriation: No Ecchymosis: No Fluctuance: No Erythema: No Friable: No Hemosiderin Staining: No Induration: No Mottled: No Localized Edema: No Pallor: No Rash: No Rubor: No Scarring: No Temperature / Pain Moisture Temperature: No Abnormality No Abnormalities Noted: No Tenderness on Palpation: Yes Dry / Scaly: No Maceration: No Moist: Yes Wound Preparation Ulcer Cleansing: Rinsed/Irrigated with Saline Topical Anesthetic Applied: Other: lidocaine 4%, Treatment Notes Wound #1 (Left, Dorsal Hand - 1st Digit) 1. Cleansed with: Clean wound with Normal Saline 4. Dressing Applied: Aquacel Ag 5. Secondary Dressing Applied Gauze and Kerlix/Conform 7. Secured  with Recruitment consultant) Signed: 04/24/2015 4:07:00 PM By: Regan Lemming BSN, RN Entered By: Regan Lemming on 04/24/2015 08:15:50 Christine [name], Christine [name] ([id number]) Christine [name], Christine [name] AMarland Kitchen ([id number]) -------------------------------------------------------------------------------- Vitals Details Christine [name] Date of Service: 04/24/2015 8:00 AM Patient Name: A. Patient Account Number: [id number] Medical Record Treating RN: Baruch Gouty RN, BSN, Velva Harman [id number] Number: Other Clinician: Date of Birth/Sex: Aug 22, 1932 (82 y.o. Female) Treating ROBSON, [name] Primary Care Physician/Extender: Christine [name] Physician: Referring Physician: Olga [name] in Treatment: 3 Vital Signs Time Taken: 08:07 Temperature (F): 98.1 Height (in): 65 Pulse (bpm): 61 Weight (lbs): 219 Respiratory Rate (breaths/min): 18 Body Mass Index (BMI): 36.4 Blood Pressure (mmHg): 123/58 Reference Range: 80 - 120 mg / dl Electronic Signature(s) Signed: 04/24/2015 4:07:00 PM By: Regan Lemming BSN, RN Entered By: Regan Lemming on 04/24/2015 08:09:46

## 2015-05-01 ENCOUNTER — Encounter: Payer: Medicare Other | Admitting: Internal Medicine

## 2015-05-01 DIAGNOSIS — S61300A Unspecified open wound of right index finger with damage to nail, initial encounter: Secondary | ICD-10-CM | POA: Diagnosis not present

## 2015-05-03 NOTE — Progress Notes (Signed)
Christine [name], Christine [name] ([id number]) Visit Report for 05/01/2015 Chief Complaint Document Details Christine [name] Date of Service: 05/01/2015 8:00 AM Patient Name: A. Patient Account Number: [id number] Medical Record Treating RN: Baruch Gouty RN, BSN, Velva Harman [id number] Number: Other Clinician: Date of Birth/Sex: [birth year] (82 y.o. Female) Treating Mckinzie Saksa Primary Care Physician/Extender: Delorise Royals, John Physician: Referring Physician: Olga Millers in Treatment: 4 Information Obtained from: Patient Chief Complaint Patient arrives for review of a necrotic wound on the dorsal aspect of her left in the index finger Electronic Signature(s) Signed: 05/02/2015 5:01:27 PM By: Linton Ham MD Entered By: Linton Ham on 05/01/2015 08:22:07 Christine [name] ([id number]) -------------------------------------------------------------------------------- HPI Details Christine [name] Date of Service: 05/01/2015 8:00 AM Patient Name: A. Patient Account Number: [id number] Medical Record Treating RN: Baruch Gouty RN, BSN, Velva Harman [id number] Number: Other Clinician: Date of Birth/Sex: 1932-02-23 (82 y.o. Female) Treating Dellia Nims, Margeart Allender Primary Care Physician/Extender: Delorise Royals, John Physician: Referring Physician: Olga Millers in Treatment: 4 History of Present Illness HPI Description: 04/03/15 Christine [name] is a pleasant independent 82 year old woman who arrives today accompanied by her daughter. She tells me that roughly 2 weeks ago she noted swelling of the distal part of her left index finger. She denies any specific injury but in particular she denies any form of bite injury either herself or pets. She had no trauma to the finger that she knows of. In any case the swelling became worse and the pain became more severe. Initially she hid this from her daughter but eventually showed it to her. She ended up being admitted to [facility] on 3/11 she had  a felon of her finger along with cellulitis. The infected area was decompressed and her nail bed was removed due to infection. The wound was irrigated and saline dressing was applied.. The culture of this grew methicillin sensitive staph aureus. She was ultimately discharged on Keflex which she completed one to 2 days ago. She saw her primary physician at the current normal clinic on 3/15. He noted prior granulation tissue. Use silver nitrate and use Xeroform. 04/10/15; the patient arrives having taken 1 week of doxycycline. Deep culture of the wound that I did last week was negative which I found somewhat surprising.. She has been using Aquacel Ag to the wound area changing daily 04/18/15; the patient is taken another week of doxycycline which makes 2 weeks. Deep culture of the original substantial wound on the dorsal aspect of her left index finger was surprisingly negative. In spite of this the wound has been healing nicely. 04/24/15; patient is completed her third week of doxycycline and has another week to go after this. She apparently got the dressing wet.. She reports no pain, swelling, drainage most of her pain is in her right hand 05/01/15; she has completed antibiotics. She has no open wound on the left index finger. Everything is closed. She does not complain of pain, swelling or drainage Electronic Signature(s) Signed: 05/02/2015 5:01:27 PM By: Linton Ham MD Entered By: Linton Ham on 05/01/2015 08:22:53 Christine [name] ([id number]) -------------------------------------------------------------------------------- Physical Exam Details Christine [name] Date of Service: 05/01/2015 8:00 AM Patient Name: A. Patient Account Number: [id number] Medical Record Treating RN: Baruch Gouty RN, BSN, Velva Harman [id number] Number: Other Clinician: Date of Birth/Sex: [birth year] (82 y.o. Female) Treating Brent Taillon Primary Care Physician/Extender: Delorise Royals, John Physician: Referring  Physician: Olga Millers in Treatment: 4 Constitutional Sitting or standing Blood Pressure is within target range for patient.. Pulse regular and  within target range for patient.Marland Kitchen Respirations regular, non-labored and within target range.. Temperature is normal and within the target range for the patient.. Patient's appearance is neat and clean. Appears in no acute distress. Well nourished and well developed.. Eyes Conjunctivae clear. No discharge.. Lymphatic no noes in epitrochlear and axilla. Musculoskeletal DIP he has no ligamentous support, this is been destroyed whether this is old or more recent I am uncertain there is no effusion. Integumentary (Hair, Skin) No open wound on the left index finger. Psychiatric No evidence of depression, anxiety, or agitation. Calm, cooperative, and communicative. Appropriate interactions and affect.. Notes Wound exam; there is no open area on her index finger. Everything is epithelialized. The DIP has no effusion and no tenderness Electronic Signature(s) Signed: 05/02/2015 5:01:27 PM By: Linton Ham MD Entered By: Linton Ham on 05/01/2015 08:24:53 Christine [name] ([id number]) -------------------------------------------------------------------------------- Physician Orders Details Christine [name] Date of Service: 05/01/2015 8:00 AM Patient Name: A. Patient Account Number: [id number] Medical Record Treating RN: Baruch Gouty RN, BSN, Velva Harman [id number] Number: Other Clinician: Date of Birth/Sex: [birth year] (82 y.o. Female) Treating Dellia Nims, Mikko Lewellen Primary Care Physician/Extender: Delorise Royals, John Physician: Referring Physician: Olga Millers in Treatment: 4 Verbal / Phone Orders: Yes Clinician: Afful, RN, BSN, Rita Read Back and Verified: Yes Diagnosis Coding Discharge From [facility] Services o Discharge from [place] Completed Electronic Signature(s) Signed: 05/02/2015 3:02:50 PM By: Regan Lemming  BSN, RN Signed: 05/02/2015 5:01:27 PM By: Linton Ham MD Entered By: Regan Lemming on 05/01/2015 08:17:32 Christine [name] ([id number]) -------------------------------------------------------------------------------- Problem List Details Christine [name] Date of Service: 05/01/2015 8:00 AM Patient Name: A. Patient Account Number: [id number] Medical Record Treating RN: Baruch Gouty RN, BSN, Velva Harman [id number] Number: Other Clinician: Date of Birth/Sex: September 06, 1932 (82 y.o. Female) Treating Velena Keegan Primary Care Physician/Extender: Delorise Royals, John Physician: Referring Physician: Olga Millers in Treatment: 4 Active Problems ICD-10 Encounter Code Description Active Date Diagnosis S61.300A Unspecified open wound of right index finger with damage 04/03/2015 Yes to nail, initial encounter L03.114 Cellulitis of left upper limb 04/03/2015 Yes Inactive Problems Resolved Problems Electronic Signature(s) Signed: 05/02/2015 5:01:27 PM By: Linton Ham MD Entered By: Linton Ham on 05/01/2015 08:21:45 Christine [name] ([id number]) -------------------------------------------------------------------------------- Progress Note Details Christine [name] Date of Service: 05/01/2015 8:00 AM Patient Name: A. Patient Account Number: [id number] Medical Record Treating RN: Baruch Gouty RN, BSN, Velva Harman [id number] Number: Other Clinician: Date of Birth/Sex: 1932/10/11 (82 y.o. Female) Treating Muneeb Veras Primary Care Physician/Extender: Delorise Royals, John Physician: Referring Physician: Olga Millers in Treatment: 4 Subjective Chief Complaint Information obtained from Patient Patient arrives for review of a necrotic wound on the dorsal aspect of her left in the index finger History of Present Illness (HPI) 04/03/15 Mrs. Hargreaves is a pleasant independent 82 year old woman who arrives today accompanied by her daughter. She tells me that roughly 2 weeks ago she noted  swelling of the distal part of her left index finger. She denies any specific injury but in particular she denies any form of bite injury either herself or pets. She had no trauma to the finger that she knows of. In any case the swelling became worse and the pain became more severe. Initially she hid this from her daughter but eventually showed it to her. She ended up being admitted to [facility] on 3/11 she had a felon of her finger along with cellulitis. The infected area was decompressed and her nail bed was removed due to infection.  The wound was irrigated and saline dressing was applied.. The culture of this grew methicillin sensitive staph aureus. She was ultimately discharged on Keflex which she completed one to 2 days ago. She saw her primary physician at the current normal clinic on 3/15. He noted prior granulation tissue. Use silver nitrate and use Xeroform. 04/10/15; the patient arrives having taken 1 week of doxycycline. Deep culture of the wound that I did last week was negative which I found somewhat surprising.. She has been using Aquacel Ag to the wound area changing daily 04/18/15; the patient is taken another week of doxycycline which makes 2 weeks. Deep culture of the original substantial wound on the dorsal aspect of her left index finger was surprisingly negative. In spite of this the wound has been healing nicely. 04/24/15; patient is completed her third week of doxycycline and has another week to go after this. She apparently got the dressing wet.. She reports no pain, swelling, drainage most of her pain is in her right hand 05/01/15; she has completed antibiotics. She has no open wound on the left index finger. Everything is closed. She does not complain of pain, swelling or drainage Objective Christine [name], Christine A. ([id number]) Constitutional Sitting or standing Blood Pressure is within target range for patient.. Pulse regular and within target  range for patient.Marland Kitchen Respirations regular, non-labored and within target range.. Temperature is normal and within the target range for the patient.. Patient's appearance is neat and clean. Appears in no acute distress. Well nourished and well developed.. Vitals Time Taken: 8:05 AM, Height: 65 in, Weight: 219 lbs, BMI: 36.4, Temperature: 98.1 F, Pulse: 58 bpm, Respiratory Rate: 18 breaths/min, Blood Pressure: 131/58 mmHg. Eyes Conjunctivae clear. No discharge.. Lymphatic no noes in epitrochlear and axilla. Musculoskeletal DIP he has no ligamentous support, this is been destroyed whether this is old or more recent I am uncertain there is no effusion. Psychiatric No evidence of depression, anxiety, or agitation. Calm, cooperative, and communicative. Appropriate interactions and affect.. General Notes: Wound exam; there is no open area on her index finger. Everything is epithelialized. The DIP has no effusion and no tenderness Integumentary (Hair, Skin) No open wound on the left index finger. Wound #1 status is Healed - Epithelialized. Original cause of wound was Gradually Appeared. The wound is located on the Left,Dorsal Hand - 1st Digit. The wound measures 0cm length x 0cm width x 0cm depth; 0cm^2 area and 0cm^3 volume. The wound is limited to skin breakdown. There is no tunneling or undermining noted. There is a none present amount of drainage noted. The wound margin is distinct with the outline attached to the wound base. There is no granulation within the wound bed. There is no necrotic tissue within the wound bed. The periwound skin appearance did not exhibit: Callus, Crepitus, Excoriation, Fluctuance, Friable, Induration, Localized Edema, Rash, Scarring, Dry/Scaly, Maceration, Moist, Atrophie Blanche, Cyanosis, Ecchymosis, Hemosiderin Staining, Mottled, Pallor, Rubor, Erythema. Periwound temperature was noted as No Abnormality. Assessment Active Problems ICD-10 DAVE, ARTERS  A. ([id number]) S61.300A - Unspecified open wound of right index finger with damage to nail, initial encounter L03.114 - Cellulitis of left upper limb Plan Discharge From [facility] Services: Discharge from [place] Completed #1 the left index finger wound which was a result of cellulitis has totally resolved. Had some concerns about there being a septic arthritis in the DIP joint of this finger when she first came in however with prolonged use of antibiotics everything seems to have resolved. I admitted  the patient and her daughter aware that I have some concern about recurrence here however at this point in time I don't think any further imaging is warranted. There is no fluid in the joint and therefore I don't think any aspiration is indicated currently, however if she develops pain swelling or joint effusion she may need to be seen acutely. Electronic Signature(s) Signed: 05/02/2015 5:01:27 PM By: Linton Ham MD Entered By: Linton Ham on 05/01/2015 08:26:16 Christine [name] ([id number]) -------------------------------------------------------------------------------- SuperBill Details Christine [name] Date of Service: 05/01/2015 Patient Name: A. Patient Account Number: [id number] Medical Record Treating RN: Baruch Gouty RN, BSN, Velva Harman [id number] Number: Other Clinician: Date of Birth/Sex: 1932-03-20 (82 y.o. Female) Treating Stiles Maxcy Primary Care Physician/Extender: Delorise Royals John Physician: Suella Grove in Treatment: 4 Referring Physician: Lynett Fish Diagnosis Coding ICD-10 Codes Code Description S61.300A Unspecified open wound of right index finger with damage to nail, initial encounter L03.114 Cellulitis of left upper limb Facility Procedures CPT4 Code: [id number] Description: [phone] - WOUND CARE VISIT-LEV 2 EST PT Modifier: Quantity: 1 Physician Procedures CPT4: Description Modifier Quantity Code [id number] - WC PHYS LEVEL 3 - EST PT 1 ICD-10  Description Diagnosis S61.300A Unspecified open wound of right index finger with damage to nail, initial encounter Electronic Signature(s) Signed: 05/02/2015 5:01:27 PM By: Linton Ham MD Entered By: Linton Ham on 05/01/2015 08:26:44

## 2015-05-03 NOTE — Progress Notes (Signed)
Christine [name], Christine [name] ([id number]) Visit Report for 05/01/2015 Arrival Information Details LEXISS, LAMKIN Date of Service: 05/01/2015 8:00 AM Patient Name: A. Patient Account Number: [id number] Medical Record Treating RN: Baruch Gouty RN, BSN, Velva Harman [id number] Number: Other Clinician: Date of Birth/Sex: October 07, 1932 (82 y.o. Female) Treating Dellia Nims, MICHAEL Primary Care Physician/Extender: Delorise Royals, John Physician: Referring Physician: Olga Millers in Treatment: 4 Visit Information History Since Last Visit Added or deleted any medications: No Patient Arrived: Ambulatory Any new allergies or adverse reactions: No Arrival Time: 08:04 Had a fall or experienced change in No Accompanied By: dtr activities of daily living that may affect Transfer Assistance: None risk of falls: Patient Identification Verified: Yes Signs or symptoms of abuse/neglect since last No Secondary Verification Process Yes visito Completed: Hospitalized since last visit: No Patient Requires Transmission-Based No Has Dressing in Place as Prescribed: Yes Precautions: Pain Present Now: No Patient Has Alerts: No Electronic Signature(s) Signed: 05/02/2015 3:02:50 PM By: Regan Lemming BSN, RN Entered By: Regan Lemming on 05/01/2015 08:05:16 Christine [name] ([id number]) -------------------------------------------------------------------------------- Clinic Level of Care Assessment Details [name] Date of Service: 05/01/2015 8:00 AM Patient Name: A. Patient Account Number: [id number] Medical Record Treating RN: Baruch Gouty RN, BSN, Velva Harman [id number] Number: Other Clinician: Date of Birth/Sex: 12-Jan-1933 (82 y.o. Female) Treating ROBSON, MICHAEL Primary Care Physician/Extender: Delorise Royals, John Physician: Referring Physician: Olga Millers in Treatment: 4 Clinic Level of Care Assessment Items TOOL 4 Quantity Score []  - Use when only an EandM is performed on FOLLOW-UP visit 0 ASSESSMENTS  - Nursing Assessment / Reassessment X - Reassessment of Co-morbidities (includes updates in patient status) 1 10 X - Reassessment of Adherence to Treatment Plan 1 5 ASSESSMENTS - Wound and Skin Assessment / Reassessment []  - Simple Wound Assessment / Reassessment - one wound 0 []  - Complex Wound Assessment / Reassessment - multiple wounds 0 []  - Dermatologic / Skin Assessment (not related to wound area) 0 ASSESSMENTS - Focused Assessment []  - Circumferential Edema Measurements - multi extremities 0 []  - Nutritional Assessment / Counseling / Intervention 0 []  - Lower Extremity Assessment (monofilament, tuning fork, pulses) 0 []  - Peripheral Arterial Disease Assessment (using hand held doppler) 0 ASSESSMENTS - Ostomy and/or Continence Assessment and Care []  - Incontinence Assessment and Management 0 []  - Ostomy Care Assessment and Management (repouching, etc.) 0 PROCESS - Coordination of Care X - Simple Patient / Family Education for ongoing care 1 15 []  - Complex (extensive) Patient / Family Education for ongoing care 0 []  - Staff obtains Consents, Records, Test Results / Process Orders 0 Christine [name], Christine A. ([id number]) []  - Staff telephones HHA, Nursing Homes / Clarify orders / etc 0 []  - Routine Transfer to another Facility (non-emergent condition) 0 []  - Routine Hospital Admission (non-emergent condition) 0 []  - New Admissions / Biomedical engineer / Ordering NPWT, Apligraf, etc. 0 []  - Emergency Hospital Admission (emergent condition) 0 []  - Simple Discharge Coordination 0 []  - Complex (extensive) Discharge Coordination 0 PROCESS - Special Needs []  - Pediatric / Minor Patient Management 0 []  - Isolation Patient Management 0 []  - Hearing / Language / Visual special needs 0 []  - Assessment of Community assistance (transportation, D/C planning, etc.) 0 []  - Additional assistance / Altered mentation 0 []  - Support Surface(s) Assessment (bed, cushion, seat, etc.)  0 INTERVENTIONS - Wound Cleansing / Measurement []  - Simple Wound Cleansing - one wound 0 []  - Complex Wound Cleansing - multiple wounds 0 X - Wound Imaging (photographs -  any number of wounds) 1 5 []  - Wound Tracing (instead of photographs) 0 []  - Simple Wound Measurement - one wound 0 []  - Complex Wound Measurement - multiple wounds 0 INTERVENTIONS - Wound Dressings []  - Small Wound Dressing one or multiple wounds 0 []  - Medium Wound Dressing one or multiple wounds 0 []  - Large Wound Dressing one or multiple wounds 0 []  - Application of Medications - topical 0 []  - Application of Medications - injection 0 Christine [name], Christine A. ([id number]) INTERVENTIONS - Miscellaneous []  - External ear exam 0 []  - Specimen Collection (cultures, biopsies, blood, body fluids, etc.) 0 []  - Specimen(s) / Culture(s) sent or taken to Lab for analysis 0 []  - Patient Transfer (multiple staff / Harrel Lemon Lift / Similar devices) 0 []  - Simple Staple / Suture removal (25 or less) 0 []  - Complex Staple / Suture removal (26 or more) 0 []  - Hypo / Hyperglycemic Management (close monitor of Blood Glucose) 0 []  - Ankle / Brachial Index (ABI) - do not check if billed separately 0 X - Vital Signs 1 5 Has the patient been seen at the hospital within the last three years: Yes Total Score: 40 Level Of Care: New/Established - Level 2 Electronic Signature(s) Signed: 05/02/2015 3:02:50 PM By: Regan Lemming BSN, RN Entered By: Regan Lemming on 05/01/2015 08:18:02 Christine [name] ([id number]) -------------------------------------------------------------------------------- Encounter Discharge Information Details [name] Date of Service: 05/01/2015 8:00 AM Patient Name: A. Patient Account Number: [id number] Medical Record Treating RN: Baruch Gouty RN, BSN, Velva Harman [id number] Number: Other Clinician: Date of Birth/Sex: May 11, 1932 (82 y.o. Female) Treating ROBSON, MICHAEL Primary Care Physician/Extender: Delorise Royals,  John Physician: Referring Physician: Olga Millers in Treatment: 4 Encounter Discharge Information Items Discharge Pain Level: 0 Discharge Condition: Stable Ambulatory Status: Ambulatory Discharge Destination: Home Transportation: Private Auto Accompanied By: dtr Schedule Follow-up Appointment: No Medication Reconciliation completed and provided to Patient/Care No Christine [name]: Provided on Clinical Summary of Care: 05/01/2015 Form Type Recipient Paper Patient MF Electronic Signature(s) Signed: 05/01/2015 8:21:05 AM By: Ruthine Dose Entered By: Ruthine Dose on 05/01/2015 08:21:05 Christine [name] ([id number]) -------------------------------------------------------------------------------- Multi-Disciplinary Care Plan Details [name] Date of Service: 05/01/2015 8:00 AM Patient Name: A. Patient Account Number: [id number] Medical Record Treating RN: Baruch Gouty RN, BSN, Velva Harman [id number] Number: Other Clinician: Date of Birth/Sex: 05-Jul-1932 (82 y.o. Female) Treating Dellia Nims, MICHAEL Primary Care Physician/Extender: Delorise Royals, John Physician: Referring Physician: Olga Millers in Treatment: 4 Active Inactive Electronic Signature(s) Signed: 05/02/2015 3:02:50 PM By: Regan Lemming BSN, RN Entered By: Regan Lemming on 05/01/2015 08:19:03 Christine [name] ([id number]) -------------------------------------------------------------------------------- Pain Assessment Details [name] Date of Service: 05/01/2015 8:00 AM Patient Name: A. Patient Account Number: [id number] Medical Record Treating RN: Baruch Gouty RN, BSN, Velva Harman [id number] Number: Other Clinician: Date of Birth/Sex: [birth year] (82 y.o. Female) Treating Dellia Nims, MICHAEL Primary Care Physician/Extender: Delorise Royals, John Physician: Referring Physician: Olga Millers in Treatment: 4 Active Problems Location of Pain Severity and Description of Pain Patient Has Paino No Site  Locations Pain Management and Medication Current Pain Management: Electronic Signature(s) Signed: 05/02/2015 3:02:50 PM By: Regan Lemming BSN, RN Entered By: Regan Lemming on 05/01/2015 08:05:22 Christine [name] ([id number]) -------------------------------------------------------------------------------- Patient/Caregiver Education Details [name] Date of Service: 05/01/2015 8:00 AM Patient Name: A. Patient Account Number: [id number] Medical Record Treating RN: Baruch Gouty RN, BSN, Velva Harman [id number] Number: Other Clinician: Date of Birth/Gender: 03-22-1932 (82 y.o. Female) Treating Dellia Nims, MICHAEL Primary Care Physician/Extender: Delorise Royals John Physician: Suella Grove in  Treatment: 4 Referring Physician: Lynett Fish Education Assessment Education Provided To: Patient Education Topics Provided Basic Hygiene: Methods: Explain/Verbal Responses: State content correctly [employer]) Signed: 05/02/2015 3:02:50 PM By: Regan Lemming BSN, RN Entered By: Regan Lemming on 05/01/2015 08:18:33 Christine [name] ([id number]) -------------------------------------------------------------------------------- Wound Assessment Details [name] Date of Service: 05/01/2015 8:00 AM Patient Name: A. Patient Account Number: [id number] Medical Record Treating RN: Baruch Gouty RN, BSN, Velva Harman [id number] Number: Other Clinician: Date of Birth/Sex: January 30, 1932 (82 y.o. Female) Treating ROBSON, MICHAEL Primary Care Physician/Extender: Delorise Royals, John Physician: Referring Physician: Olga Millers in Treatment: 4 Wound Status Wound Number: 1 Primary Abscess Etiology: Wound Location: Left Hand - 1st Digit - Dorsal Wound Healed - Epithelialized Wounding Event: Gradually Appeared Status: Date Acquired: 03/20/2015 Comorbid Cataracts, Anemia, Arrhythmia, Weeks Of Treatment: 4 History: Congestive Heart Failure, Clustered Wound: No Hypertension, Osteoarthritis Photos Photo  Uploaded By: Regan Lemming on 05/01/2015 16:11:33 Wound Measurements Length: (cm) 0 % Reduction Width: (cm) 0 % Reduction Depth: (cm) 0 Epithelializ Area: (cm) 0 Tunneling: Volume: (cm) 0 Undermining in Area: 100% in Volume: 100% ation: Large (67-100%) No : No Wound Description Full Thickness Without Exposed Classification: Support Structures Wound Margin: Distinct, outline attached Exudate None Present Amount: Foul Odor After Cleansing: No Wound Bed Granulation Amount: None Present (0%) Exposed Structure Christine [name], Christine A. ([id number]) Necrotic Amount: None Present (0%) Fascia Exposed: No Fat Layer Exposed: No Tendon Exposed: No Muscle Exposed: No Joint Exposed: No Bone Exposed: No Limited to Skin Breakdown Periwound Skin Texture Texture Color No Abnormalities Noted: No No Abnormalities Noted: No Callus: No Atrophie Blanche: No Crepitus: No Cyanosis: No Excoriation: No Ecchymosis: No Fluctuance: No Erythema: No Friable: No Hemosiderin Staining: No Induration: No Mottled: No Localized Edema: No Pallor: No Rash: No Rubor: No Scarring: No Temperature / Pain Moisture Temperature: No Abnormality No Abnormalities Noted: No Dry / Scaly: No Maceration: No Moist: No Wound Preparation Ulcer Cleansing: Rinsed/Irrigated with Saline Topical Anesthetic Applied: None Electronic Signature(s) Signed: 05/02/2015 3:02:50 PM By: Regan Lemming BSN, RN Entered By: Regan Lemming on 05/01/2015 08:17:02 Christine [name] ([id number]) -------------------------------------------------------------------------------- Vitals Details [name] Date of Service: 05/01/2015 8:00 AM Patient Name: A. Patient Account Number: [id number] Medical Record Treating RN: Baruch Gouty RN, BSN, Velva Harman [id number] Number: Other Clinician: Date of Birth/Sex: 04/01/1932 (82 y.o. Female) Treating ROBSON, MICHAEL Primary Care Physician/Extender: Delorise Royals, John Physician: Referring  Physician: Olga Millers in Treatment: 4 Vital Signs Time Taken: 08:05 Temperature (F): 98.1 Height (in): 65 Pulse (bpm): 58 Weight (lbs): 219 Respiratory Rate (breaths/min): 18 Body Mass Index (BMI): 36.4 Blood Pressure (mmHg): 131/58 Reference Range: 80 - 120 mg / dl Electronic Signature(s) Signed: 05/02/2015 3:02:50 PM By: Regan Lemming BSN, RN Entered By: Regan Lemming on 05/01/2015 08:07:29

## 2015-07-26 ENCOUNTER — Encounter: Payer: Self-pay | Admitting: Internal Medicine

## 2015-10-08 ENCOUNTER — Other Ambulatory Visit (INDEPENDENT_AMBULATORY_CARE_PROVIDER_SITE_OTHER): Payer: Medicare Other

## 2015-10-08 ENCOUNTER — Encounter: Payer: Self-pay | Admitting: Internal Medicine

## 2015-10-08 ENCOUNTER — Encounter (INDEPENDENT_AMBULATORY_CARE_PROVIDER_SITE_OTHER): Payer: Self-pay

## 2015-10-08 ENCOUNTER — Ambulatory Visit (INDEPENDENT_AMBULATORY_CARE_PROVIDER_SITE_OTHER): Payer: Medicare Other | Admitting: Internal Medicine

## 2015-10-08 VITALS — BP 128/72 | HR 64 | Ht 62.0 in | Wt 205.0 lb

## 2015-10-08 DIAGNOSIS — R634 Abnormal weight loss: Secondary | ICD-10-CM

## 2015-10-08 DIAGNOSIS — D509 Iron deficiency anemia, unspecified: Secondary | ICD-10-CM

## 2015-10-08 DIAGNOSIS — R197 Diarrhea, unspecified: Secondary | ICD-10-CM

## 2015-10-08 DIAGNOSIS — R630 Anorexia: Secondary | ICD-10-CM

## 2015-10-08 LAB — COMPREHENSIVE METABOLIC PANEL
ALBUMIN: 3.6 g/dL (ref 3.5–5.2)
ALK PHOS: 109 U/L (ref 39–117)
ALT: 4 U/L (ref 0–35)
AST: 16 U/L (ref 0–37)
BUN: 10 mg/dL (ref 6–23)
CALCIUM: 8.9 mg/dL (ref 8.4–10.5)
CO2: 32 mEq/L (ref 19–32)
CREATININE: 0.92 mg/dL (ref 0.40–1.20)
Chloride: 100 mEq/L (ref 96–112)
GFR: 75.28 mL/min (ref >60.00)
Glucose, Bld: 86 mg/dL (ref 70–99)
POTASSIUM: 4 meq/L (ref 3.5–5.1)
SODIUM: 139 meq/L (ref 135–145)
TOTAL PROTEIN: 7.1 g/dL (ref 6.0–8.3)
Total Bilirubin: 0.5 mg/dL (ref 0.2–1.2)

## 2015-10-08 LAB — CBC WITH DIFFERENTIAL/PLATELET
BASOS PCT: 0.8 % (ref 0.0–3.0)
Basophils Absolute: 0 10*3/uL (ref 0.0–0.1)
EOS PCT: 0.9 % (ref 0.0–5.0)
Eosinophils Absolute: 0 10*3/uL (ref 0.0–0.7)
HCT: 30.5 % — ABNORMAL LOW (ref 36.0–46.0)
Hemoglobin: 10.3 g/dL — ABNORMAL LOW (ref 12.0–15.0)
LYMPHS ABS: 0.8 10*3/uL (ref 0.7–4.0)
Lymphocytes Relative: 27.8 % (ref 12.0–46.0)
MCHC: 33.8 g/dL (ref 30.0–36.0)
MCV: 88.6 fl (ref 78.0–100.0)
MONOS PCT: 6.7 % (ref 3.0–12.0)
Monocytes Absolute: 0.2 10*3/uL (ref 0.1–1.0)
NEUTROS ABS: 1.9 10*3/uL (ref 1.4–7.7)
NEUTROS PCT: 63.8 % (ref 43.0–77.0)
PLATELETS: 297 10*3/uL (ref 150.0–400.0)
RBC: 3.45 Mil/uL — ABNORMAL LOW (ref 3.87–5.11)
RDW: 13.6 % (ref 11.5–15.5)
WBC: 3.1 10*3/uL — AB (ref 4.0–10.5)

## 2015-10-08 LAB — FERRITIN: FERRITIN: 45.2 ng/mL (ref 10.0–291.0)

## 2015-10-08 NOTE — Patient Instructions (Signed)
   You have been scheduled for an endoscopy and colonoscopy. Please follow the written instructions given to you at your visit today. Please pick up your prep supplies at the pharmacy. If you use inhalers (even only as needed), please bring them with you on the day of your procedure.    Your physician has requested that you go to the basement for the lab work before leaving today.      I appreciate the opportunity to care for you. Silvano Rusk, MD, [facility]

## 2015-10-08 NOTE — Progress Notes (Signed)
Christine [name] 83 y.o. [birth year] [id number]  Assessment & Plan:   Encounter Diagnoses  Name Primary?  . Loss of weight Yes  . Diarrhea, unspecified type   . Anorexia   . Anemia, iron deficiency    Cause of her problems is not clear but she has objective findings of iron deficiency anemia in the past loss of weight and diarrhea so endoscopic evaluation as well as labs are appropriate. At her age and with these findings gastrointestinal neoplasm i.e. cancer is certainly possible.   CMET, CBC, ferritin EGD/Colonoscopy The risks and benefits as well as alternatives of endoscopic procedure(s) have been discussed and reviewed. All questions answered. The patient agrees to proceed.  Subjective:   Chief Complaint:Stomach and bowels HPI Very nice 50 year old woman here with her daughter and a family friend because of problems with loss of appetite, excessive gas belching and diarrhea. She has lost approximately 15-20 pounds in the last 6 months about. She has been treated with PPI without benefit. She saw Dr. Vira Agar and was told to change her diet but he did not recommend any testing. Her diarrhea is urgent postprandial diarrhea several times a day and has had some nocturnal symptoms. There is a history of anemia with low iron saturation in the chart from a year or more ago. She is not hungry she has some early satiety perhaps. Her diet is poor, she drinks sodas coffee milk and uses sweet and low with the coffee. She done that for years predating the diarrhea. She has never had any type of endoscopic workup. Her GI review of systems is otherwise negative. Allergies  Allergen Reactions  . Sulfa Antibiotics Other (See Comments)    Reaction: unknown Patient states no allergies, but family states allergy  . Penicillins Rash and Other (See Comments)    Family states rash, patient states no allergies   Outpatient Medications Prior to Visit  Medication Sig Dispense Refill  . torsemide  (DEMADEX) 20 MG tablet Take 20 mg by mouth 2 (two) times daily.    Marland Kitchen olmesartan (BENICAR) 40 MG tablet Take 40 mg by mouth daily.    . cephALEXin (KEFLEX) 500 MG capsule Take 1 capsule (500 mg total) by mouth 2 (two) times daily. (Patient not taking: Reported on 10/08/2015) 14 capsule 0  . HYDROcodone-acetaminophen (NORCO) 5-325 MG tablet Take 1 tablet by mouth every 6 (six) hours as needed for moderate pain. (Patient not taking: Reported on 10/08/2015) 30 tablet 0  . mometasone (ELOCON) 0.1 % lotion Apply 4 drops topically at bedtime as needed. Drops placed in each ear  1  . omeprazole (PRILOSEC) 40 MG capsule Take 40 mg by mouth daily.     No facility-administered medications prior to visit.    Past Medical History:  Diagnosis Date  . Aortic insufficiency   . Carpal tunnel syndrome, bilateral   . Cellulitis of left index finger   . GERD (gastroesophageal reflux disease)   . H. pylori infection   . HLD (hyperlipidemia)   . Hypertension   . Hyperthyroidism   . Mild cognitive impairment   . Osteoarthritis   . Peripheral edema   . Vitamin B 12 deficiency    Past Surgical History:  Procedure Laterality Date  . CARPAL TUNNEL RELEASE Bilateral   . TOTAL KNEE ARTHROPLASTY Bilateral    Social History   Social History  . Marital status: Single    Spouse name: N/A  . Number of children: N/A  . Years of education: N/A  Social History Main Topics  . Smoking status: Former Research scientist (life sciences)  . Smokeless tobacco: None  . Alcohol use No  . Drug use: No  . Sexual activity: Not Asked   Other Topics Concern  . None   Social History Narrative   Single, one son to daughters.   Family History  Problem Relation Age of Onset  . Stroke Mother   . Alzheimer's disease Father   . Diabetes Sister      Review of Systems Cellulitis and abscess of the left hand index finger treated with IND and antibiotics in March she was hospitalized briefly Pedal edema mild confusion wears eyeglasses back pain.  All other review of systems negative or as per history of present illness.  Objective:   Physical Exam @BP  128/72 (BP Location: Left Arm, Patient Position: Sitting)   Pulse 64   Ht 5\' 2"  (1.575 m)   Wt 205 lb (93 kg)   SpO2 98%   BMI 37.49 kg/m @  General:  Well-developed, well-nourished and in no acute distress Eyes:  anicteric. ENT:   Mouth and posterior pharynx free of lesions. + dentures Neck:   supple w/o thyromegaly or mass.  Lungs: Clear to auscultation bilaterally. Heart:  S1S2, no rubs, murmurs, gallops. Abdomen:  soft, non-tender, no hepatosplenomegaly, hernia, or mass and BS+.  Rectal: deferred Lymph:  no cervical or supraclavicular adenopathy. Extremities:   2+ feet/ankle/pretibial edema, cyanosis or clubbing Skin   no rash. Neuro:  A&O x 3.  Psych:  appropriate mood and  Affect.   Data Reviewed:  Wt Readings from Last 3 Encounters:  10/08/15 205 lb (93 kg)  03/23/15 232 lb (105.2 kg)  03/23/15 220 lb (99.8 kg)   Hospital discharge summary from March 2017 labs in the computer 2014 2 2017 Care everywhere labs from 2016 immune based Hemoccult testing was negative September 2016 TIBC was 502 with saturation 12% iron 61 Hemoglobin was 10.7 with an MCV of 89 on 02/08/2015 [facility] was 11.4 in August 2015 TSH normal 2016

## 2015-10-09 ENCOUNTER — Encounter: Payer: Self-pay | Admitting: Internal Medicine

## 2015-10-10 NOTE — Progress Notes (Signed)
Labs show persistent anemia Low iron Await EGD and colonoscopy

## 2015-10-19 ENCOUNTER — Encounter: Payer: Self-pay | Admitting: Internal Medicine

## 2015-11-01 ENCOUNTER — Ambulatory Visit (AMBULATORY_SURGERY_CENTER): Payer: Medicare Other | Admitting: Internal Medicine

## 2015-11-01 ENCOUNTER — Encounter: Payer: Self-pay | Admitting: Internal Medicine

## 2015-11-01 VITALS — BP 140/67 | HR 60 | Temp 97.1°F | Resp 18 | Ht 62.0 in | Wt 205.0 lb

## 2015-11-01 DIAGNOSIS — R197 Diarrhea, unspecified: Secondary | ICD-10-CM

## 2015-11-01 DIAGNOSIS — D509 Iron deficiency anemia, unspecified: Secondary | ICD-10-CM | POA: Diagnosis not present

## 2015-11-01 DIAGNOSIS — R634 Abnormal weight loss: Secondary | ICD-10-CM | POA: Diagnosis not present

## 2015-11-01 MED ORDER — SODIUM CHLORIDE 0.9 % IV SOLN: 500.0000 mL | INTRAVENOUS | Status: DC

## 2015-11-01 NOTE — Op Note (Signed)
[facility] Patient Name: Christine [name] Procedure Date: 11/01/2015 1:53 PM MRN: [id number] Endoscopist: Gatha Mayer , MD Age: 82 Referring MD:  Date of Birth: [birth year] Gender: Female Account #: [id number] Procedure:                Upper GI endoscopy Indications:              Iron deficiency anemia, Diarrhea, Weight loss Medicines:                Propofol per Anesthesia, Monitored Anesthesia Care Procedure:                Pre-Anesthesia Assessment:                           - Prior to the procedure, a History and Physical                            was performed, and patient medications and                            allergies were reviewed. The patient's tolerance of                            previous anesthesia was also reviewed. The risks                            and benefits of the procedure and the sedation                            options and risks were discussed with the patient.                            All questions were answered, and informed consent                            was obtained. Prior Anticoagulants: The patient has                            taken no previous anticoagulant or antiplatelet                            agents. ASA Grade Assessment: III - A patient with                            severe systemic disease. After reviewing the risks                            and benefits, the patient was deemed in                            satisfactory condition to undergo the procedure.                           After obtaining informed consent, the endoscope was  passed under direct vision. Throughout the                            procedure, the patient's blood pressure, pulse, and                            oxygen saturations were monitored continuously. The                            Model GIF-[id number]) scope was introduced                            through the mouth, and advanced to the second part                          of duodenum. The upper GI endoscopy was                            accomplished without difficulty. The patient                            tolerated the procedure well. Scope In: Scope Out: Findings:                 The esophagus was normal.                           The stomach was normal.                           The examined duodenum was normal.                           The cardia and gastric fundus were normal on                            retroflexion. Complications:            No immediate complications. Estimated Blood Loss:     Estimated blood loss: none. Impression:               - Normal esophagus.                           - Normal stomach.                           - Normal examined duodenum.                           - No specimens collected. Recommendation:           - Patient has a contact number available for                            emergencies. The signs and symptoms of potential                            delayed complications  were discussed with the                            patient. Return to normal activities tomorrow.                            Written discharge instructions were provided to the                            patient.                           - Resume previous diet.                           - Continue present medications.                           - See the other procedure note for documentation of                            additional recommendations. Gatha Mayer, MD 11/01/2015 2:31:04 PM This report has been signed electronically.

## 2015-11-01 NOTE — Patient Instructions (Addendum)
I did not find any problems here. Looking over things it seems that the weight has stabilized.  Try taking Imodium AD for the diarrhea - can use 1-2 every day - either in the AM or twice a day.  Follow-up with Dr. Gilford Rile also.  If the diarrhea persists he can consider holding the olmesartan medication as it may be a cause of diarrhea.  I appreciate the opportunity to care for you. Gatha Mayer, MD, FACG   YOU HAD AN ENDOSCOPIC PROCEDURE TODAY AT [facility]:   Refer to the procedure report that was given to you for any specific questions about what was found during the examination.  If the procedure report does not answer your questions, please call your gastroenterologist to clarify.  If you requested that your care partner not be given the details of your procedure findings, then the procedure report has been included in a sealed envelope for you to review at your convenience later.  YOU SHOULD EXPECT: Some feelings of bloating in the abdomen. Passage of more gas than usual.  Walking can help get rid of the air that was put into your GI tract during the procedure and reduce the bloating. If you had a lower endoscopy (such as a colonoscopy or flexible sigmoidoscopy) you may notice spotting of blood in your stool or on the toilet paper. If you underwent a bowel prep for your procedure, you may not have a normal bowel movement for a few days.  Please Note:  You might notice some irritation and congestion in your nose or some drainage.  This is from the oxygen used during your procedure.  There is no need for concern and it should clear up in a day or so.  SYMPTOMS TO REPORT IMMEDIATELY:   Following lower endoscopy (colonoscopy or flexible sigmoidoscopy):  Excessive amounts of blood in the stool  Significant tenderness or worsening of abdominal pains  Swelling of the abdomen that is new, acute  Fever of 100F or higher   Following upper endoscopy  (EGD)  Vomiting of blood or coffee ground material  New chest pain or pain under the shoulder blades  Painful or persistently difficult swallowing  New shortness of breath  Fever of 100F or higher  Black, tarry-looking stools  For urgent or emergent issues, a gastroenterologist can be reached at any hour by calling [phone].   DIET:  We do recommend a small meal at first, but then you may proceed to your regular diet.  Drink plenty of fluids but you should avoid alcoholic beverages for 24 hours.  ACTIVITY:  You should plan to take it easy for the rest of today and you should NOT DRIVE or use heavy machinery until tomorrow (because of the sedation medicines used during the test).    FOLLOW UP: Our staff will call the number listed on your records the next business day following your procedure to check on you and address any questions or concerns that you may have regarding the information given to you following your procedure. If we do not reach you, we will leave a message.  However, if you are feeling well and you are not experiencing any problems, there is no need to return our call.  We will assume that you have returned to your regular daily activities without incident.  If any biopsies were taken you will be contacted by phone or by letter within the next 1-3 weeks.  Please call us at [phone]  if you have not heard about the biopsies in 3 weeks.    SIGNATURES/CONFIDENTIALITY: You and/or your care partner have signed paperwork which will be entered into your electronic medical record.  These signatures attest to the fact that that the information above on your After Visit Summary has been reviewed and is understood.  Full responsibility of the confidentiality of this discharge information lies with you and/or your care-partner.  Read all of the handouts given to you by your recovery room nurse.   Thank-you for choosing [place] for your healthcare needs today.

## 2015-11-01 NOTE — Op Note (Signed)
[facility] Patient Name: Christine [name] Procedure Date: 11/01/2015 1:53 PM MRN: [id number] Endoscopist: Gatha Mayer , MD Age: 82 Referring MD:  Date of Birth: [birth year] Gender: Female Account #: [id number] Procedure:                Colonoscopy Indications:              Unexplained iron deficiency anemia, Clinically                            significant diarrhea of unexplained origin, Weight                            loss Medicines:                Propofol per Anesthesia, Monitored Anesthesia Care Procedure:                Pre-Anesthesia Assessment:                           - Prior to the procedure, a History and Physical                            was performed, and patient medications and                            allergies were reviewed. The patient's tolerance of                            previous anesthesia was also reviewed. The risks                            and benefits of the procedure and the sedation                            options and risks were discussed with the patient.                            All questions were answered, and informed consent                            was obtained. Prior Anticoagulants: The patient has                            taken no previous anticoagulant or antiplatelet                            agents. ASA Grade Assessment: III - A patient with                            severe systemic disease. After reviewing the risks                            and benefits, the patient was deemed in  satisfactory condition to undergo the procedure.                           After obtaining informed consent, the colonoscope                            was passed under direct vision. Throughout the                            procedure, the patient's blood pressure, pulse, and                            oxygen saturations were monitored continuously. The                            Model CF-HQ190L  [id number]) scope was introduced                            through the anus and advanced to the the cecum,                            identified by appendiceal orifice and ileocecal                            valve. The ileocecal valve, appendiceal orifice,                            and rectum were photographed. The quality of the                            bowel preparation was good. The bowel preparation                            used was Miralax. Scope In: 2:01:44 PM Scope Out: 2:17:28 PM Scope Withdrawal Time: 0 hours 7 minutes 59 seconds  Total Procedure Duration: 0 hours 15 minutes 44 seconds  Findings:                 The perianal and digital rectal examinations were                            normal.                           Diverticula were found in the sigmoid colon. There                            was narrowing of the colon in association with the                            diverticular opening.                           The exam was otherwise without abnormality on  direct and retroflexion views. Complications:            No immediate complications. Estimated blood loss:                            None. Estimated Blood Loss:     Estimated blood loss: none. Recommendation:           - Resume previous diet.                           - Continue present medications.                           - Patient has a contact number available for                            emergencies. The signs and symptoms of potential                            delayed complications were discussed with the                            patient. Return to normal activities tomorrow.                            Written discharge instructions were provided to the                            patient.                           - Resume previous diet.                           - Continue present medications.                           - No repeat colonoscopy due to age.                            -                           TRY IMODIUM AD 1-2 EACH DAY FOR DIARRHEA                           NOTE THAT WEIGHT HAS STABILIZED                           F/U DR. Gilford Rile PCP                           IF PERSISTENT DIARRHEA WOULD HOLD OLMEASARTAN OR                            SWITCH TO SEE IF THAT IS CAUSE  SEE ME AS NEEDED Gatha Mayer, MD 11/01/2015 2:34:28 PM This report has been signed electronically.

## 2015-11-01 NOTE — Progress Notes (Signed)
Report to PACU, RN, vss, BBS= Clear.  

## 2015-11-02 ENCOUNTER — Telehealth: Payer: Self-pay | Admitting: *Deleted

## 2015-11-02 NOTE — Telephone Encounter (Signed)
  Follow up Call-  Call back number 11/01/2015  Post procedure Call Back phone  # [phone]  Permission to leave phone message Yes  Some recent data might be hidden     Patient questions:  Do you have a fever, pain , or abdominal swelling? No. Pain Score  0 *  Have you tolerated food without any problems? Yes.    Have you been able to return to your normal activities? Yes.    Do you have any questions about your discharge instructions: Diet   No. Medications  No. Follow up visit  No.  Do you have questions or concerns about your Care? No.  Actions: * If pain score is 4 or above: No action needed, pain <4.

## 2016-02-21 ENCOUNTER — Other Ambulatory Visit: Payer: Self-pay | Admitting: Nurse Practitioner

## 2016-02-21 DIAGNOSIS — R413 Other amnesia: Secondary | ICD-10-CM

## 2016-03-03 ENCOUNTER — Ambulatory Visit
Admission: RE | Admit: 2016-03-03 | Discharge: 2016-03-03 | Disposition: A | Discharge status: Home / Self Care | Payer: Medicare Other | Source: Ambulatory Visit | Attending: Nurse Practitioner | Admitting: Nurse Practitioner

## 2016-03-03 DIAGNOSIS — R413 Other amnesia: Secondary | ICD-10-CM | POA: Diagnosis not present

## 2016-03-03 DIAGNOSIS — G319 Degenerative disease of nervous system, unspecified: Secondary | ICD-10-CM | POA: Insufficient documentation

## 2016-09-03 ENCOUNTER — Encounter: Payer: Self-pay | Admitting: Emergency Medicine

## 2016-09-03 ENCOUNTER — Emergency Department
Admission: EM | Admit: 2016-09-03 | Discharge: 2016-09-03 | Disposition: A | Discharge status: Home / Self Care | Payer: Medicare Other | Attending: Emergency Medicine | Admitting: Emergency Medicine

## 2016-09-03 DIAGNOSIS — Z87891 Personal history of nicotine dependence: Secondary | ICD-10-CM | POA: Insufficient documentation

## 2016-09-03 DIAGNOSIS — I10 Essential (primary) hypertension: Secondary | ICD-10-CM | POA: Insufficient documentation

## 2016-09-03 DIAGNOSIS — E876 Hypokalemia: Secondary | ICD-10-CM | POA: Diagnosis not present

## 2016-09-03 DIAGNOSIS — R634 Abnormal weight loss: Secondary | ICD-10-CM | POA: Insufficient documentation

## 2016-09-03 DIAGNOSIS — R001 Bradycardia, unspecified: Secondary | ICD-10-CM | POA: Diagnosis not present

## 2016-09-03 DIAGNOSIS — Z79899 Other long term (current) drug therapy: Secondary | ICD-10-CM | POA: Diagnosis not present

## 2016-09-03 DIAGNOSIS — E869 Volume depletion, unspecified: Secondary | ICD-10-CM | POA: Diagnosis not present

## 2016-09-03 DIAGNOSIS — I959 Hypotension, unspecified: Secondary | ICD-10-CM | POA: Diagnosis not present

## 2016-09-03 DIAGNOSIS — E119 Type 2 diabetes mellitus without complications: Secondary | ICD-10-CM | POA: Insufficient documentation

## 2016-09-03 DIAGNOSIS — R630 Anorexia: Secondary | ICD-10-CM | POA: Diagnosis not present

## 2016-09-03 DIAGNOSIS — E860 Dehydration: Secondary | ICD-10-CM | POA: Diagnosis present

## 2016-09-03 DIAGNOSIS — F0390 Unspecified dementia without behavioral disturbance: Secondary | ICD-10-CM | POA: Insufficient documentation

## 2016-09-03 HISTORY — DX: Unspecified dementia, unspecified severity, without behavioral disturbance, psychotic disturbance, mood disturbance, and anxiety: F03.90

## 2016-09-03 LAB — COMPREHENSIVE METABOLIC PANEL
ALK PHOS: 81 U/L (ref 38–126)
ALT: 8 U/L — AB (ref 14–54)
AST: 19 U/L (ref 15–41)
Albumin: 3.1 g/dL — ABNORMAL LOW (ref 3.5–5.0)
Anion gap: 12 (ref 5–15)
BUN: 16 mg/dL (ref 6–20)
CALCIUM: 8.9 mg/dL (ref 8.9–10.3)
CHLORIDE: 94 mmol/L — AB (ref 101–111)
CO2: 29 mmol/L (ref 22–32)
CREATININE: 1.39 mg/dL — AB (ref 0.44–1.00)
GFR calc Af Amer: 40 mL/min — ABNORMAL LOW (ref >60)
GFR, EST NON AFRICAN AMERICAN: 34 mL/min — AB (ref >60)
Glucose, Bld: 91 mg/dL (ref 65–99)
Potassium: 3.1 mmol/L — ABNORMAL LOW (ref 3.5–5.1)
Sodium: 135 mmol/L (ref 135–145)
Total Bilirubin: 1 mg/dL (ref 0.3–1.2)
Total Protein: 6.9 g/dL (ref 6.5–8.1)

## 2016-09-03 LAB — CBC
HCT: 31.9 % — ABNORMAL LOW (ref 35.0–47.0)
Hemoglobin: 10.9 g/dL — ABNORMAL LOW (ref 12.0–16.0)
MCH: 28.5 pg (ref 26.0–34.0)
MCHC: 34 g/dL (ref 32.0–36.0)
MCV: 83.9 fL (ref 80.0–100.0)
PLATELETS: 290 10*3/uL (ref 150–440)
RBC: 3.81 MIL/uL (ref 3.80–5.20)
RDW: 15.7 % — AB (ref 11.5–14.5)
WBC: 2.9 10*3/uL — AB (ref 3.6–11.0)

## 2016-09-03 LAB — URINALYSIS, COMPLETE (UACMP) WITH MICROSCOPIC
Bacteria, UA: NONE SEEN
Bilirubin Urine: NEGATIVE
Glucose, UA: NEGATIVE mg/dL
Hgb urine dipstick: NEGATIVE
Ketones, ur: NEGATIVE mg/dL
LEUKOCYTES UA: NEGATIVE
Nitrite: NEGATIVE
PH: 5 (ref 5.0–8.0)
Protein, ur: NEGATIVE mg/dL
RBC / HPF: NONE SEEN RBC/hpf (ref 0–5)
SPECIFIC GRAVITY, URINE: 1.008 (ref 1.005–1.030)
SQUAMOUS EPITHELIAL / LPF: NONE SEEN
WBC, UA: NONE SEEN WBC/hpf (ref 0–5)

## 2016-09-03 LAB — MAGNESIUM: Magnesium: 1.3 mg/dL — ABNORMAL LOW (ref 1.7–2.4)

## 2016-09-03 LAB — LIPASE, BLOOD: LIPASE: 26 U/L (ref 11–51)

## 2016-09-03 MED ORDER — POTASSIUM CHLORIDE 20 MEQ PO PACK
40.0000 meq | PACK | ORAL | Status: AC
  Administered 2016-09-03: 40 meq via ORAL
  Filled 2016-09-03: qty 2

## 2016-09-03 MED ORDER — MAGNESIUM SULFATE 2 GM/50ML IV SOLN
2.0000 g | Freq: Once | INTRAVENOUS | Status: AC
  Administered 2016-09-03: 2 g via INTRAVENOUS
  Filled 2016-09-03: qty 50

## 2016-09-03 MED ORDER — SODIUM CHLORIDE 0.9 % IV BOLUS (SEPSIS)
500.0000 mL | INTRAVENOUS | Status: AC
  Administered 2016-09-03: 500 mL via INTRAVENOUS

## 2016-09-03 NOTE — ED Notes (Signed)
MD Karma Greaser at  Bedside

## 2016-09-03 NOTE — ED Provider Notes (Signed)
[facility] Emergency Department Provider Note  ____________________________________________   First MD Initiated Contact with Patient 09/03/16 1139     (approximate)  I have reviewed the triage vital signs and the nursing notes.   HISTORY  Chief Complaint Dehydration and Hypotension  Level 5 caveat:  history/ROS limited by chronic dementia  HPI Christine [name] is a 83 y.o. female with medical history as listed below and including mild dementia according to her daughter who is also present at bedside and provides the patient with care at home.  They arrived by private vehicle for evaluation of hypotension and possible dehydration as determined by the patient's primary care doctor, Dr. Gilford Rile, on a clinic visit today.  The patient's daughter reports that they last saw Dr. Gilford Rile about a month ago and since that time she has been "wasting away" with a significant amount of unintentional weight loss although both she and the patient acknowledged that the patient has not had much of an appetite for a month or more and therefore has been eating and drinking a lot less than usual.  The patient states that she simply does not feel hungry and has no desire to eat if she does not feel hungry.  She denies fever/chills, chest pain, shortness of breath, nausea, vomiting, abdominal pain, abdominal distention, and dysuria.  Her daughter feels like she has been urinating less than usual recently but also again points out that the patient has been drinking less than usual recently.  Nothing in particular makes the patient's symptoms better nor worse.  Her daughter also points out that her blood pressure is been consistently low since the clinic visit last month.  It is unclear how low was in clinic but at triage it was in the low end of normal with a systolic of just above 902.  Although the patient does have mild dementia, she is alert and oriented at this time, answering questions  appropriately, and in no acute distress.  Past Medical History:  Diagnosis Date  . Aortic insufficiency   . Carpal tunnel syndrome, bilateral   . Cellulitis of left index finger   . Dementia   . GERD (gastroesophageal reflux disease)   . H. pylori infection   . HLD (hyperlipidemia)   . Hypertension   . Hyperthyroidism   . Mild cognitive impairment   . Osteoarthritis   . Peripheral edema   . Vitamin B 12 deficiency     Patient Active Problem List   Diagnosis Date Noted  . Cellulitis of index finger 03/24/2015  . HTN (hypertension) 03/24/2015  . HLD (hyperlipidemia) 03/24/2015  . GERD (gastroesophageal reflux disease) 03/24/2015  . Type 2 diabetes mellitus ([place]) 03/24/2015    Past Surgical History:  Procedure Laterality Date  . CARPAL TUNNEL RELEASE Bilateral   . TOTAL KNEE ARTHROPLASTY Bilateral     Prior to Admission medications   Medication Sig Start Date End Date Taking? Authorizing Provider  naproxen sodium (ANAPROX) 220 MG tablet Take 220 mg by mouth as needed.    [provider]  olmesartan (BENICAR) 40 MG tablet Take 40 mg by mouth daily.    [provider]  torsemide (DEMADEX) 20 MG tablet Take 20 mg by mouth 2 (two) times daily.    [provider]    Allergies Sulfa antibiotics and Penicillins  Family History  Problem Relation Age of Onset  . Stroke Mother   . Alzheimer's disease Father   . Diabetes Sister  Social History Social History  Substance Use Topics  . Smoking status: Former Research scientist (life sciences)  . Smokeless tobacco: Never Used  . Alcohol use No    Review of Systems Level 5 caveat:  history/ROS limited by chronic dementia  Constitutional: No fever/chills Eyes: No visual changes. ENT: No sore throat. Cardiovascular: Denies chest pain. Respiratory: Denies shortness of breath. Gastrointestinal: No abdominal pain.  No nausea, no vomiting.  No diarrhea.  No constipation.  Weight loss due to decreased oral intake over the  last 1+ months Genitourinary: Negative for dysuria.  Reportedly has been having decreased urinary frequency Musculoskeletal: Negative for neck pain.  Negative for back pain. Integumentary: Negative for rash. Neurological: Negative for headaches, focal weakness or numbness.   ____________________________________________   PHYSICAL EXAM:  VITAL SIGNS: ED Triage Vitals  Enc Vitals Group     BP 09/03/16 1053 104/71     Pulse Rate 09/03/16 1053 70     Resp 09/03/16 1053 18     Temp 09/03/16 1053 98.1 F (36.7 C)     Temp Source 09/03/16 1053 Oral     SpO2 09/03/16 1053 100 %     Weight 09/03/16 1054 77.1 kg (170 lb)     Height 09/03/16 1054 1.626 m (5\' 4" )     Head Circumference --      Peak Flow --      Pain Score --      Pain Loc --      Pain Edu? --      Excl. in [place]? --     Constitutional: Alert and oriented To person and place and she also knows her daughter. Well appearing and in no acute distress. Eyes: Conjunctivae are normal.  Head: Atraumatic. Nose: No congestion/rhinnorhea. Mouth/Throat: Mucous membranes are moist. Neck: No stridor.  No meningeal signs.   Cardiovascular: Normal rate, regular rhythm. Good peripheral circulation. Grossly normal heart sounds. Respiratory: Normal respiratory effort.  No retractions. Lungs CTAB. Gastrointestinal: Soft with mild tenderness to palpation of the suprapubic region, no rebound and no guarding.  No abdominal distention. Musculoskeletal: No lower extremity tenderness nor edema. No gross deformities of extremities. Neurologic:  Normal speech and language. No gross focal neurologic deficits are appreciated.  Skin:  Skin is warm, dry and intact. No rash noted. Psychiatric: Mood and affect are normal. Speech and behavior are normal.  ____________________________________________   LABS (all labs ordered are listed, but only abnormal results are displayed)  Labs Reviewed  COMPREHENSIVE METABOLIC PANEL - Abnormal; Notable for the  following:       Result Value   Potassium 3.1 (*)    Chloride 94 (*)    Creatinine, Ser 1.39 (*)    Albumin 3.1 (*)    ALT 8 (*)    GFR calc non Af Amer 34 (*)    GFR calc Af Amer 40 (*)    All other components within normal limits  CBC - Abnormal; Notable for the following:    WBC 2.9 (*)    Hemoglobin 10.9 (*)    HCT 31.9 (*)    RDW 15.7 (*)    All other components within normal limits  URINALYSIS, COMPLETE (UACMP) WITH MICROSCOPIC - Abnormal; Notable for the following:    Color, Urine YELLOW (*)    APPearance CLEAR (*)    All other components within normal limits  MAGNESIUM - Abnormal; Notable for the following:    Magnesium 1.3 (*)    All other components within normal limits  LIPASE, BLOOD  ____________________________________________  EKG  ED ECG REPORT I, Venecia Mehl, the attending physician, personally viewed and interpreted this ECG.  Date: 09/03/2016 EKG Time: 12:08 Rate: 45 Rhythm: Bradycardia with irregular rate QRS Axis: normal Intervals: Prolonged PR interval, widened QRS, right bundle branch block and left anterior fascicular block with probable LVH ST/T Wave abnormalities: Non-specific ST segment / T-wave changes, but no evidence of acute ischemia. Narrative Interpretation: Similar to ECG from 2014 but more profound bradycardia and interval prolongations  ____________________________________________  RADIOLOGY   No results found.  ____________________________________________   PROCEDURES  Critical Care performed: No   Procedure(s) performed:   Procedures   ____________________________________________   INITIAL IMPRESSION / ASSESSMENT AND PLAN / ED COURSE  Pertinent labs & imaging results that were available during my care of the patient were reviewed by me and considered in my medical decision making (see chart for details).  The patient is generally well-appearing and appears younger than her age.  Her daughter reports that her  symptoms of been chronic for at least a month.  We discussed slow general health decline as elderly patients develop early satiety and decreased appetite and decreased oral intake in general, and I explained that we would evaluate her broadly for any acute emergent medical issues, evidence of renal insufficiency, and her tract infection given her suprapubic tenderness, etc, and that I would give a small fluid bolus given that she is borderline hypertensive and has apparently been eating and drinking less than usual.  However the daughter is very aware that in the absence of any acute or emergent conditions requiring hospitalization, the patient will likely be appropriate for discharge and outpatient follow-up and she is comfortable with that plan.  The patient agrees as well.  I explained that she may have anything to eat or drink she would like at this time.  Although she does have some tenderness to palpation of the suprapubic region, it is minimal and isolated to the area around her bladder and I do not feel she would benefit from a CT scan of the abdomen and pelvis at this time.  I will reassess after her labs are back.   Clinical Course as of Sep 03 1457  Wed Sep 03, 2016  1300 Given ECG changes, I am paging Dr. Humphrey Rolls to review her current ECG and discuss.  [CF]  1312 I spoke by phone with Dr. Humphrey Rolls and we reviewed the EKG.  He suggested that depending on the family's degree of comfort, the patient could either be brought into the hospital for observation and cardiology consult, or we could replete her electrolytes as I am already doing and she could follow up in clinic in the morning and 9:00 AM.  I will discuss this with the patient's family and work with her and her daughter to determine the best plan for her.  [CF]  1345 I had a lengthy conversation with the patient's 2 daughters.  I offered observation in the hospital versus close outpatient follow-up as described above, and they both feel comfortable  taking her home.  I clarified with both of them that she has had no near syncopal or syncopal episodes and is not complaining of any lightheadedness that I might suspect would result from her bradycardia.  Given that fact, and the fact that she has been essentially stable with just a slow general decline over the last month, I think she is appropriate for close follow-up.  I am still awaiting a urinalysis and then we will  plan for discharge and close follow-up.  I also let Dr. Humphrey Rolls know that they will be seeing him in clinic tomorrow and he confirmed that plan.  [CF]  6226 The patient is stable and asymptomatic.  I reiterated the plan with the patient and  her 2 daughters and they are all in agreement with close outpatient follow-up in the morning.  She is already taking a potassium supplement of 20 mEq per day and I encouraged them to give her the same supplement twice a daily since early follow-up with Dr. Humphrey Rolls to discuss additional valleculae supplements.  I gave my usual and customary return precautions.     [CF]    Clinical Course User Index [CF] Hinda Kehr, MD    ____________________________________________  FINAL CLINICAL IMPRESSION(S) / ED DIAGNOSES  Final diagnoses:  Volume depletion  Hypokalemia  Hypomagnesemia  Bradycardia  Recent weight loss  Decreased appetite     MEDICATIONS GIVEN DURING THIS VISIT:  Medications  sodium chloride 0.9 % bolus 500 mL (0 mLs Intravenous Stopped 09/03/16 1418)  magnesium sulfate IVPB 2 g 50 mL (0 g Intravenous Stopped 09/03/16 1418)  potassium chloride (KLOR-CON) packet 40 mEq (40 mEq Oral Given 09/03/16 1343)     NEW OUTPATIENT MEDICATIONS STARTED DURING THIS VISIT:  New Prescriptions   No medications on file    Modified Medications   No medications on file    Discontinued Medications   No medications on file     Note:  This document was prepared using Dragon voice recognition software and may include unintentional dictation  errors.    Hinda Kehr, MD 09/03/16 [phone]

## 2016-09-03 NOTE — Discharge Instructions (Signed)
Your workup in the Emergency Department today was reassuring in general, although we do feel that close follow-up with a cardiologist would be beneficial.  Given your relatively slow heartbeat and low electrolytes (potassium and magnesium), we did bring up the possibility of staying in the hospital overnight, but we all agreed that following up with Dr. Humphrey Rolls in the morning at 9:00 AM it is probably better for you.  We recommend you drink plenty of fluids, take your regular medications and/or any new ones prescribed today, and follow up with the doctor(s) listed in these documents as recommended.    Please start taking your potassium supplement twice a day instead of only once and discuss any additional supplements Dr. Humphrey Rolls might recommend at your appointment tomorrow morning.  Return to the Emergency Department if you develop new or worsening symptoms that concern you.

## 2016-09-03 NOTE — ED Triage Notes (Signed)
Pt here with dehydration and low bp, sent from Dr Derry Skill office. Pt denies pain at this time. bp in triage 104/71. Daughter states she has lost over 20lbs in the past month. Pt states her stomach feels "full" all of the time.

## 2017-04-26 ENCOUNTER — Inpatient Hospital Stay: Payer: Medicare Other

## 2017-04-26 ENCOUNTER — Inpatient Hospital Stay
Admission: EM | Admit: 2017-04-26 | Discharge: 2017-04-28 | DRG: 871 | Disposition: A | Discharge status: Hospice | Payer: Medicare Other | Attending: Internal Medicine | Admitting: Internal Medicine

## 2017-04-26 ENCOUNTER — Emergency Department: Payer: Medicare Other

## 2017-04-26 ENCOUNTER — Other Ambulatory Visit: Payer: Self-pay

## 2017-04-26 DIAGNOSIS — C786 Secondary malignant neoplasm of retroperitoneum and peritoneum: Secondary | ICD-10-CM | POA: Diagnosis present

## 2017-04-26 DIAGNOSIS — A419 Sepsis, unspecified organism: Principal | ICD-10-CM | POA: Diagnosis present

## 2017-04-26 DIAGNOSIS — I10 Essential (primary) hypertension: Secondary | ICD-10-CM | POA: Diagnosis present

## 2017-04-26 DIAGNOSIS — D508 Other iron deficiency anemias: Secondary | ICD-10-CM

## 2017-04-26 DIAGNOSIS — B9689 Other specified bacterial agents as the cause of diseases classified elsewhere: Secondary | ICD-10-CM | POA: Diagnosis present

## 2017-04-26 DIAGNOSIS — I351 Nonrheumatic aortic (valve) insufficiency: Secondary | ICD-10-CM | POA: Diagnosis present

## 2017-04-26 DIAGNOSIS — Z79899 Other long term (current) drug therapy: Secondary | ICD-10-CM

## 2017-04-26 DIAGNOSIS — D509 Iron deficiency anemia, unspecified: Secondary | ICD-10-CM | POA: Diagnosis present

## 2017-04-26 DIAGNOSIS — R748 Abnormal levels of other serum enzymes: Secondary | ICD-10-CM | POA: Diagnosis not present

## 2017-04-26 DIAGNOSIS — R609 Edema, unspecified: Secondary | ICD-10-CM

## 2017-04-26 DIAGNOSIS — Z880 Allergy status to penicillin: Secondary | ICD-10-CM

## 2017-04-26 DIAGNOSIS — Z87891 Personal history of nicotine dependence: Secondary | ICD-10-CM

## 2017-04-26 DIAGNOSIS — Z96653 Presence of artificial knee joint, bilateral: Secondary | ICD-10-CM | POA: Diagnosis present

## 2017-04-26 DIAGNOSIS — R634 Abnormal weight loss: Secondary | ICD-10-CM

## 2017-04-26 DIAGNOSIS — Z882 Allergy status to sulfonamides status: Secondary | ICD-10-CM | POA: Diagnosis not present

## 2017-04-26 DIAGNOSIS — C569 Malignant neoplasm of unspecified ovary: Secondary | ICD-10-CM | POA: Diagnosis present

## 2017-04-26 DIAGNOSIS — R160 Hepatomegaly, not elsewhere classified: Secondary | ICD-10-CM | POA: Diagnosis not present

## 2017-04-26 DIAGNOSIS — J189 Pneumonia, unspecified organism: Secondary | ICD-10-CM | POA: Diagnosis not present

## 2017-04-26 DIAGNOSIS — M1990 Unspecified osteoarthritis, unspecified site: Secondary | ICD-10-CM | POA: Diagnosis present

## 2017-04-26 DIAGNOSIS — C787 Secondary malignant neoplasm of liver and intrahepatic bile duct: Secondary | ICD-10-CM | POA: Diagnosis present

## 2017-04-26 DIAGNOSIS — Z515 Encounter for palliative care: Secondary | ICD-10-CM | POA: Diagnosis present

## 2017-04-26 DIAGNOSIS — E785 Hyperlipidemia, unspecified: Secondary | ICD-10-CM | POA: Diagnosis present

## 2017-04-26 DIAGNOSIS — D638 Anemia in other chronic diseases classified elsewhere: Secondary | ICD-10-CM | POA: Diagnosis present

## 2017-04-26 DIAGNOSIS — J181 Lobar pneumonia, unspecified organism: Secondary | ICD-10-CM | POA: Diagnosis present

## 2017-04-26 DIAGNOSIS — D649 Anemia, unspecified: Secondary | ICD-10-CM

## 2017-04-26 DIAGNOSIS — R188 Other ascites: Secondary | ICD-10-CM | POA: Diagnosis present

## 2017-04-26 DIAGNOSIS — Z23 Encounter for immunization: Secondary | ICD-10-CM | POA: Diagnosis not present

## 2017-04-26 DIAGNOSIS — Z66 Do not resuscitate: Secondary | ICD-10-CM | POA: Diagnosis present

## 2017-04-26 DIAGNOSIS — E871 Hypo-osmolality and hyponatremia: Secondary | ICD-10-CM | POA: Diagnosis present

## 2017-04-26 DIAGNOSIS — F0390 Unspecified dementia without behavioral disturbance: Secondary | ICD-10-CM | POA: Diagnosis present

## 2017-04-26 DIAGNOSIS — E0590 Thyrotoxicosis, unspecified without thyrotoxic crisis or storm: Secondary | ICD-10-CM | POA: Diagnosis present

## 2017-04-26 DIAGNOSIS — R4182 Altered mental status, unspecified: Secondary | ICD-10-CM | POA: Diagnosis not present

## 2017-04-26 DIAGNOSIS — R197 Diarrhea, unspecified: Secondary | ICD-10-CM

## 2017-04-26 DIAGNOSIS — R112 Nausea with vomiting, unspecified: Secondary | ICD-10-CM | POA: Diagnosis not present

## 2017-04-26 DIAGNOSIS — E538 Deficiency of other specified B group vitamins: Secondary | ICD-10-CM | POA: Diagnosis not present

## 2017-04-26 DIAGNOSIS — I959 Hypotension, unspecified: Secondary | ICD-10-CM | POA: Diagnosis not present

## 2017-04-26 DIAGNOSIS — R509 Fever, unspecified: Secondary | ICD-10-CM | POA: Diagnosis not present

## 2017-04-26 LAB — COMPREHENSIVE METABOLIC PANEL
ALT: 112 U/L — ABNORMAL HIGH (ref 14–54)
ANION GAP: 8 (ref 5–15)
AST: 277 U/L — AB (ref 15–41)
Albumin: 3.3 g/dL — ABNORMAL LOW (ref 3.5–5.0)
Alkaline Phosphatase: 257 U/L — ABNORMAL HIGH (ref 38–126)
BUN: 15 mg/dL (ref 6–20)
CHLORIDE: 98 mmol/L — AB (ref 101–111)
CO2: 26 mmol/L (ref 22–32)
Calcium: 8.3 mg/dL — ABNORMAL LOW (ref 8.9–10.3)
Creatinine, Ser: 0.93 mg/dL (ref 0.44–1.00)
GFR calc non Af Amer: 56 mL/min — ABNORMAL LOW (ref >60)
Glucose, Bld: 116 mg/dL — ABNORMAL HIGH (ref 65–99)
POTASSIUM: 3.7 mmol/L (ref 3.5–5.1)
Sodium: 132 mmol/L — ABNORMAL LOW (ref 135–145)
Total Bilirubin: 1.2 mg/dL (ref 0.3–1.2)
Total Protein: 7 g/dL (ref 6.5–8.1)

## 2017-04-26 LAB — URINALYSIS, COMPLETE (UACMP) WITH MICROSCOPIC
Bacteria, UA: NONE SEEN
Bilirubin Urine: NEGATIVE
GLUCOSE, UA: NEGATIVE mg/dL
HGB URINE DIPSTICK: NEGATIVE
KETONES UR: NEGATIVE mg/dL
LEUKOCYTES UA: NEGATIVE
NITRITE: NEGATIVE
PROTEIN: NEGATIVE mg/dL
Specific Gravity, Urine: 1.012 (ref 1.005–1.030)
pH: 7 (ref 5.0–8.0)

## 2017-04-26 LAB — FERRITIN: Ferritin: 24 ng/mL (ref 11–307)

## 2017-04-26 LAB — IRON AND TIBC
Iron: 8 ug/dL — ABNORMAL LOW (ref 28–170)
SATURATION RATIOS: 2 % — AB (ref 10.4–31.8)
TIBC: 390 ug/dL (ref 250–450)
UIBC: 382 ug/dL

## 2017-04-26 LAB — ABO/RH: ABO/RH(D): A NEG

## 2017-04-26 LAB — CBC
HCT: 20.9 % — ABNORMAL LOW (ref 35.0–47.0)
Hemoglobin: 6.7 g/dL — ABNORMAL LOW (ref 12.0–16.0)
MCH: 25.3 pg — ABNORMAL LOW (ref 26.0–34.0)
MCHC: 31.9 g/dL — AB (ref 32.0–36.0)
MCV: 79.3 fL — ABNORMAL LOW (ref 80.0–100.0)
PLATELETS: 323 10*3/uL (ref 150–440)
RBC: 2.64 MIL/uL — ABNORMAL LOW (ref 3.80–5.20)
RDW: 15.3 % — AB (ref 11.5–14.5)
WBC: 14.4 10*3/uL — AB (ref 3.6–11.0)

## 2017-04-26 LAB — PROCALCITONIN: PROCALCITONIN: 14.68 ng/mL

## 2017-04-26 LAB — PREPARE RBC (CROSSMATCH)

## 2017-04-26 MED ORDER — IOPAMIDOL (ISOVUE-300) INJECTION 61%
30.0000 mL | Freq: Once | INTRAVENOUS | Status: AC | PRN
  Administered 2017-04-26: 30 mL via ORAL

## 2017-04-26 MED ORDER — FUROSEMIDE 20 MG PO TABS
20.0000 mg | ORAL_TABLET | Freq: Every day | ORAL | Status: DC
  Administered 2017-04-27 (×2): 20 mg via ORAL
  Filled 2017-04-26 (×2): qty 1

## 2017-04-26 MED ORDER — SODIUM CHLORIDE 0.9 % IV SOLN
500.0000 mg | Freq: Once | INTRAVENOUS | Status: AC
  Administered 2017-04-26: 500 mg via INTRAVENOUS
  Filled 2017-04-26: qty 500

## 2017-04-26 MED ORDER — ONDANSETRON HCL 4 MG/2ML IJ SOLN: 4.0000 mg | Freq: Four times a day (QID) | INTRAMUSCULAR | Status: DC | PRN

## 2017-04-26 MED ORDER — ENOXAPARIN SODIUM 40 MG/0.4ML ~~LOC~~ SOLN
40.0000 mg | SUBCUTANEOUS | Status: DC
  Administered 2017-04-27 (×2): 40 mg via SUBCUTANEOUS
  Filled 2017-04-26 (×2): qty 0.4

## 2017-04-26 MED ORDER — BISACODYL 5 MG PO TBEC: 5.0000 mg | DELAYED_RELEASE_TABLET | Freq: Every day | ORAL | Status: DC | PRN

## 2017-04-26 MED ORDER — HYDROCODONE-ACETAMINOPHEN 5-325 MG PO TABS: 1.0000 | ORAL_TABLET | ORAL | Status: DC | PRN

## 2017-04-26 MED ORDER — ALBUTEROL SULFATE (2.5 MG/3ML) 0.083% IN NEBU: 2.5000 mg | INHALATION_SOLUTION | RESPIRATORY_TRACT | Status: DC | PRN

## 2017-04-26 MED ORDER — QUETIAPINE FUMARATE 25 MG PO TABS
25.0000 mg | ORAL_TABLET | Freq: Every evening | ORAL | Status: DC
  Administered 2017-04-26 – 2017-04-27 (×2): 25 mg via ORAL
  Filled 2017-04-26 (×2): qty 1

## 2017-04-26 MED ORDER — GUAIFENESIN 100 MG/5ML PO SOLN
5.0000 mL | ORAL | Status: DC | PRN
  Filled 2017-04-26: qty 10

## 2017-04-26 MED ORDER — SENNOSIDES-DOCUSATE SODIUM 8.6-50 MG PO TABS: 1.0000 | ORAL_TABLET | Freq: Every evening | ORAL | Status: DC | PRN

## 2017-04-26 MED ORDER — PNEUMOCOCCAL VAC POLYVALENT 25 MCG/0.5ML IJ INJ
0.5000 mL | INJECTION | INTRAMUSCULAR | Status: AC
  Administered 2017-04-27: 0.5 mL via INTRAMUSCULAR
  Filled 2017-04-26: qty 0.5

## 2017-04-26 MED ORDER — SODIUM CHLORIDE 0.9 % IV SOLN
1.0000 g | Freq: Once | INTRAVENOUS | Status: AC
  Administered 2017-04-26: 1 g via INTRAVENOUS
  Filled 2017-04-26: qty 10

## 2017-04-26 MED ORDER — IOHEXOL 300 MG/ML  SOLN
100.0000 mL | Freq: Once | INTRAMUSCULAR | Status: AC | PRN
  Administered 2017-04-26: 100 mL via INTRAVENOUS

## 2017-04-26 MED ORDER — ACETAMINOPHEN 650 MG RE SUPP: 650.0000 mg | Freq: Four times a day (QID) | RECTAL | Status: DC | PRN

## 2017-04-26 MED ORDER — SODIUM CHLORIDE 0.9 % IV SOLN
INTRAVENOUS | Status: DC
  Administered 2017-04-26 – 2017-04-28 (×3): via INTRAVENOUS

## 2017-04-26 MED ORDER — ACETAMINOPHEN 325 MG PO TABS
650.0000 mg | ORAL_TABLET | Freq: Four times a day (QID) | ORAL | Status: DC | PRN
  Administered 2017-04-26 – 2017-04-27 (×2): 650 mg via ORAL
  Filled 2017-04-26 (×3): qty 2

## 2017-04-26 MED ORDER — SODIUM CHLORIDE 0.9 % IV SOLN
1.0000 g | INTRAVENOUS | Status: DC
  Filled 2017-04-26: qty 10

## 2017-04-26 MED ORDER — SODIUM CHLORIDE 0.9 % IV SOLN
10.0000 mL/h | Freq: Once | INTRAVENOUS | Status: AC
  Administered 2017-04-26: 10 mL/h via INTRAVENOUS

## 2017-04-26 MED ORDER — SODIUM CHLORIDE 0.9 % IV SOLN
500.0000 mg | INTRAVENOUS | Status: DC
  Filled 2017-04-26: qty 500

## 2017-04-26 MED ORDER — ONDANSETRON HCL 4 MG PO TABS: 4.0000 mg | ORAL_TABLET | Freq: Four times a day (QID) | ORAL | Status: DC | PRN

## 2017-04-26 MED ORDER — PANTOPRAZOLE SODIUM 40 MG IV SOLR
40.0000 mg | Freq: Two times a day (BID) | INTRAVENOUS | Status: DC
  Administered 2017-04-26 – 2017-04-27 (×3): 40 mg via INTRAVENOUS
  Filled 2017-04-26 (×3): qty 40

## 2017-04-26 NOTE — Consult Note (Signed)
Vonda Antigua, MD [address], [place], [place], [place], [address] [address], [place], [place], [place], [address] Phone: [phone]  Fax: [phone]  Consultation  Referring Provider:     Dr. Bridgett Larsson Primary Care Physician:  Madelyn Brunner, MD Reason for Consultation:     Anemia  Date of Admission:  04/26/2017 Date of Consultation:  04/26/2017         HPI:   Christine [name] is a 84 y.o. female admitted with fever and weakness at home, and found to have a left lung pneumonia versus atelectasis.  GI is being consulted due to anemia on blood work.  Hemoglobin of 6.7.  Patient and family denies any melena, hematochezia, hematemesis.  No nausea or vomiting.  Patient tolerating regular diet.  Patient denies any abdominal pain at this time.  MCV of 79.  Hemoglobin was 10.9, 7 months ago.  She has history of iron deficiency anemia, and was previously seen by Dr. Arelia Longest from [facility] for iron deficiency anemia.  His notes and procedure reports were reviewed.  She underwent colonoscopy and EGD in October 2017.  Diverticulosis was reported otherwise normal exam.  EGD was normal as well.  Past Medical History:  Diagnosis Date  . Aortic insufficiency   . Carpal tunnel syndrome, bilateral   . Cellulitis of left index finger   . Dementia   . GERD (gastroesophageal reflux disease)   . H. pylori infection   . HLD (hyperlipidemia)   . Hypertension   . Hyperthyroidism   . Mild cognitive impairment   . Osteoarthritis   . Peripheral edema   . Vitamin B 12 deficiency     Past Surgical History:  Procedure Laterality Date  . CARPAL TUNNEL RELEASE Bilateral   . TOTAL KNEE ARTHROPLASTY Bilateral     Prior to Admission medications   Medication Sig Start Date End Date Taking? Authorizing Provider  furosemide (LASIX) 20 MG tablet Take 1 tablet by mouth daily. 04/15/17  Yes [provider]  QUEtiapine (SEROQUEL) 25 MG tablet Take 1 tablet by mouth every  evening. 02/13/17 08/12/17 Yes [provider]    Family History  Problem Relation Age of Onset  . Stroke Mother   . Alzheimer's disease Father   . Diabetes Sister      Social History   Tobacco Use  . Smoking status: Former Research scientist (life sciences)  . Smokeless tobacco: Never Used  Substance Use Topics  . Alcohol use: No    Alcohol/week: 0.0 oz  . Drug use: No    Allergies as of 04/26/2017 - Review Complete 04/26/2017  Allergen Reaction Noted  . Sulfa antibiotics Other (See Comments) 03/24/2015  . Penicillins Rash and Other (See Comments) 03/24/2015    Review of Systems:    All systems reviewed and negative except where noted in HPI.   Physical Exam:  Vital signs in last 24 hours: Vitals:   04/26/17 1330 04/26/17 1400 04/26/17 1505 04/26/17 1926  BP: 123/63 (!) 142/70 (!) 150/70 128/65  Pulse: 76 77 82 77  Resp: 15 (!) 21 20 18   Temp:   98.9 F (37.2 C) 98.9 F (37.2 C)  TempSrc:   Oral Oral  SpO2: 98% 99% 94% 100%  Weight:      Height:         General:   Pleasant, cooperative in NAD Head:  Normocephalic and atraumatic. Eyes:   No icterus.   Conjunctiva pink. PERRLA. Ears:  Normal auditory acuity. Neck:  Supple;  no masses or thyroidomegaly Lungs: Respirations even and unlabored. Lungs clear to auscultation bilaterally.   No wheezes, crackles, or rhonchi.  Abdomen:  Soft, nondistended, nontender. Normal bowel sounds. No appreciable masses or hepatomegaly.  No rebound or guarding.  Neurologic:  Alert and oriented x3;  grossly normal neurologically. Skin:  Intact without significant lesions or rashes. Cervical Nodes:  No significant cervical adenopathy. Psych:  Alert and cooperative. Normal affect.  LAB RESULTS: Recent Labs    04/26/17 1137  WBC 14.4*  HGB 6.7*  HCT 20.9*  PLT 323   BMET Recent Labs    04/26/17 1137  NA 132*  K 3.7  CL 98*  CO2 26  GLUCOSE 116*  BUN 15  CREATININE 0.93  CALCIUM 8.3*   LFT Recent Labs    04/26/17 1137  PROT 7.0    ALBUMIN 3.3*  AST 277*  ALT 112*  ALKPHOS 257*  BILITOT 1.2   PT/INR No results for input(s): LABPROT, INR in the last 72 hours.  STUDIES: Ct Abdomen Pelvis W Contrast  Result Date: 04/26/2017 CLINICAL DATA:  Abdominal and pelvic pain.  Nausea and vomiting. EXAM: CT ABDOMEN AND PELVIS WITH CONTRAST TECHNIQUE: Multidetector CT imaging of the abdomen and pelvis was performed using the standard protocol following bolus administration of intravenous contrast. CONTRAST:  149m OMNIPAQUE IOHEXOL 300 MG/ML  SOLN COMPARISON:  None. FINDINGS: Lower chest: Prominent nodes in the epicardial region on series 2, image 7 are identified with a sample node measuring 2.6 by 1.3 cm. Cardiomegaly. Small bilateral pleural effusions with atelectasis. Hepatobiliary: Multiple small liver masses identified. While some may represent cysts, I suspect several small metastases in the right and left hepatic lobes. The gallbladder is mildly distended but otherwise normal. Portal vein is patent. Pancreas: Pancreas is normal. Spleen: Normal in size without focal abnormality. Adrenals/Urinary Tract: Renal cysts are identified. The kidneys and adrenal glands are otherwise normal. No renal obstruction. The bladder is unremarkable but decompressed with a Foley catheter. Stomach/Bowel: The stomach and small bowel is normal. No obstruction. The colon is normal. The appendix is not seen but there is no secondary evidence of appendicitis is identified. Vascular/Lymphatic: Atherosclerotic changes are seen in the nonaneurysmal aorta, iliac vessels, and femoral vessels. Abnormal epicardial nodes are identified as above. A few prominent para-aortic nodes are suspicious with a sample node on series 2, image 35 measuring 12 mm. Reproductive: Calcified fibroids are seen in the uterus which is deviated to the right. There are solid and cystic masses in the adnexa. The cystic component on the left measures 9.8 by 5.5 by 8.0 cm on coronal image 48 and  axial image 58. Along the superior anterior aspect of this cystic lesion is a solid mass which may be separate or part of the cystic mass measuring up to 7.5 cm on series 5, image 31. There is a solid mass in the expected location of the right ovary which measures up to 4.6 cm on axial image 53. Other: Ascites is seen throughout the abdomen. Multiple metastatic implants are seen along the surface of the liver with a representative implant measuring up to 10 cm on coronal image 27. Omental/peritoneal disease is seen elsewhere including an implant anteriorly on series 2, image 48 measuring up to 4.9 cm. A fluid collection deep in the pelvis well seen on coronal image 64 demonstrates several peripheral nodular components, consistent with metastatic implants. Musculoskeletal: Suspected metastatic disease to L4 and L3. No other bony metastatic disease identified. IMPRESSION: 1. Metastatic adenocarcinoma  with metastatic implants along the surface of the liver, in the omentum, and along the peritoneal surfaces. Multiple small nodules in the liver are likely metastatic. Abnormal epicardial nodes are metastatic and there is suspicion for metastatic nodes in the retroperitoneum. Heterogeneity of L3 and L4 is suspicious for metastatic disease as well. Given the solid and cystic masses in the pelvis, I suspect the primary malignancy is most likely ovarian, probably on the left. A PET-CT may further evaluate for a more definitive site of primary malignancy if clinically indicated. 2. Small bilateral pleural effusions. 3. Atherosclerotic changes in the aorta and branching vessels. Electronically Signed   By: Dorise Bullion III M.D   On: 04/26/2017 16:38   US Venous Img Lower Bilateral  Result Date: 04/26/2017 CLINICAL DATA:  Leg swelling. EXAM: BILATERAL LOWER EXTREMITY VENOUS DOPPLER ULTRASOUND TECHNIQUE: Gray-scale sonography with graded compression, as well as color Doppler and duplex ultrasound were performed to evaluate  the lower extremity deep venous systems from the level of the common femoral vein and including the common femoral, femoral, profunda femoral, popliteal and calf veins including the posterior tibial, peroneal and gastrocnemius veins when visible. The superficial great saphenous vein was also interrogated. Spectral Doppler was utilized to evaluate flow at rest and with distal augmentation maneuvers in the common femoral, femoral and popliteal veins. COMPARISON:  None. FINDINGS: RIGHT LOWER EXTREMITY Common Femoral Vein: No evidence of thrombus. Normal compressibility, respiratory phasicity and response to augmentation. Saphenofemoral Junction: No evidence of thrombus. Normal compressibility and flow on color Doppler imaging. Profunda Femoral Vein: No evidence of thrombus. Normal compressibility and flow on color Doppler imaging. Femoral Vein: No evidence of thrombus. Normal compressibility, respiratory phasicity and response to augmentation. Popliteal Vein: No evidence of thrombus. Normal compressibility, respiratory phasicity and response to augmentation. Calf Veins: Limited evaluation. Visualized right deep calf veins are patent without thrombus. LEFT LOWER EXTREMITY Common Femoral Vein: No evidence of thrombus. Normal compressibility, respiratory phasicity and response to augmentation. Saphenofemoral Junction: No evidence of thrombus. Normal compressibility and flow on color Doppler imaging. Profunda Femoral Vein: No evidence of thrombus. Normal compressibility and flow on color Doppler imaging. Femoral Vein: No evidence of thrombus. Normal compressibility, respiratory phasicity and response to augmentation. Popliteal Vein: No evidence of thrombus. Normal compressibility, respiratory phasicity and response to augmentation. Calf Veins: Limited evaluation. Visualized left deep calf veins are patent without thrombus. Other: Subcutaneous edema. IMPRESSION: No evidence of deep venous thrombosis in the lower  extremities. Limited evaluation of the calf veins. Electronically Signed   By: Markus Daft M.D.   On: 04/26/2017 16:19   Dg Chest Portable 1 View  Result Date: 04/26/2017 CLINICAL DATA:  Former smoker, now with fever, possible UTI, AMS EXAM: PORTABLE CHEST 1 VIEW COMPARISON:  Chest x-ray dated 11/07/2012. FINDINGS: Study is hypoinspiratory. Dense opacity at the LEFT lung base, obscuring the LEFT hemidiaphragm and portion of the LEFT heart. Probable small LEFT pleural effusion. No pneumothorax seen. Aortic atherosclerosis. No acute or suspicious osseous finding. IMPRESSION: 1. New dense opacity at the LEFT lung base, highly suspicious for pneumonia given the history of fever, alternatively atelectasis. 2. Probable small LEFT pleural effusion. 3. Probable cardiomegaly, difficult to characterize due to the obscuring opacity at the LEFT lung base. 4. Aortic atherosclerosis. Electronically Signed   By: Franki Cabot M.D.   On: 04/26/2017 11:41      Impression / Plan:   Christine [name] is a 84 y.o. y/o female with weakness at home, admitted with pneumonia, and  GI consulted for anemia, with previous history of iron deficiency anemia  No evidence of active GI bleeding at this time However, daughter states patient intermittently has dark brown or dark black stool, but not tarry looking.  She states that she has not had this recently. Internal medicine notes in care everywhere from January 2019 does show naproxen as part of her medication list.  Unsure if she is taking this. Omeprazole once daily is also part of the medication list  Source of anemia is unknown at this time Patient has chronic iron deficiency, I would recommend starting IV iron at this time However, would recommend PPI IV twice daily Continue serial CBCs and transfuse PRN Avoid NSAIDs  Would recommend medical optimization prior to any endoscopic therapy. Continue treatment for pneumonia and infections as per primary team If patient  continues to have anemia or any signs of GI bleeding, endoscopy can be considered after medical optimization   Her bilirubin is normal, however transaminases and alk phos is elevated.  Her CT also showed multiple small liver masses.  The radiologist is reading these as cysts versus small metastasis in the right and left hepatic lobes.  Ascites, and multiple metastatic implants along the surface of the liver is also reported.  Would recommend oncology consult, as this will need further workup with CAT scan versus MRI versus diagnostic paracentesis versus biopsies.  Possible EGD in 1-2 days depending on clinical status  Thank you for involving me in the care of this patient.      LOS: 0 days   Virgel Manifold, MD  04/26/2017, 8:04 PM

## 2017-04-26 NOTE — ED Notes (Signed)
Attempted to call report and was told that the nurse would call me back

## 2017-04-26 NOTE — ED Provider Notes (Signed)
[facility] Emergency Department Provider Note   First MD Initiated Contact with Patient 04/26/17 1322     (approximate)  I have reviewed the triage vital signs and the nursing notes.  L5 caveat: Altered mental status. HISTORY  Chief Complaint Fever and Urinary Tract Infection    HPI Christine [name] is a 84 y.o. female with below list of chronic medical conditions including dementia presents via EMS from home with fever of 102 and altered mental status.   Past Medical History:  Diagnosis Date  . Aortic insufficiency   . Carpal tunnel syndrome, bilateral   . Cellulitis of left index finger   . Dementia   . GERD (gastroesophageal reflux disease)   . H. pylori infection   . HLD (hyperlipidemia)   . Hypertension   . Hyperthyroidism   . Mild cognitive impairment   . Osteoarthritis   . Peripheral edema   . Vitamin B 12 deficiency     Patient Active Problem List   Diagnosis Date Noted  . Pneumonia 04/26/2017  . Cellulitis of index finger 03/24/2015  . HTN (hypertension) 03/24/2015  . HLD (hyperlipidemia) 03/24/2015  . GERD (gastroesophageal reflux disease) 03/24/2015  . Type 2 diabetes mellitus ([place]) 03/24/2015    Past Surgical History:  Procedure Laterality Date  . CARPAL TUNNEL RELEASE Bilateral   . TOTAL KNEE ARTHROPLASTY Bilateral     Prior to Admission medications   Medication Sig Start Date End Date Taking? Authorizing Provider  furosemide (LASIX) 20 MG tablet Take 1 tablet by mouth daily. 04/15/17  Yes [provider]  QUEtiapine (SEROQUEL) 25 MG tablet Take 1 tablet by mouth every evening. 02/13/17 08/12/17 Yes [provider]    Allergies Sulfa antibiotics and Penicillins  Family History  Problem Relation Age of Onset  . Stroke Mother   . Alzheimer's disease Father   . Diabetes Sister     Social History Social History   Tobacco Use  . Smoking status: Former Research scientist (life sciences)  . Smokeless tobacco: Never Used    Substance Use Topics  . Alcohol use: No    Alcohol/week: 0.0 oz  . Drug use: No    Review of Systems Constitutional: Positive for fever Eyes: No visual changes. ENT: No sore throat. Cardiovascular: Denies chest pain. Respiratory: Denies shortness of breath. Gastrointestinal: For abdominal pain.  No nausea, no vomiting.  No diarrhea.  No constipation. Genitourinary: Negative for dysuria. Musculoskeletal: Negative for neck pain.  Negative for back pain. Integumentary: Negative for rash. Neurological: Negative for headaches, focal weakness or numbness.   ____________________________________________   PHYSICAL EXAM:  VITAL SIGNS: ED Triage Vitals  Enc Vitals Group     BP 04/26/17 1125 (!) 120/57     Pulse Rate 04/26/17 1125 80     Resp 04/26/17 1130 18     Temp 04/26/17 1125 100 F (37.8 C)     Temp Source 04/26/17 1125 Oral     SpO2 04/26/17 1118 97 %     Weight 04/26/17 1127 90.7 kg (200 lb)     Height 04/26/17 1127 1.676 m (5\' 6" )     Head Circumference --      Peak Flow --      Pain Score --      Pain Loc --      Pain Edu? --      Excl. in [place]? --     Constitutional: Alert and  Eyes: Conjunctivae are pale. Head: Atraumatic. Mouth/Throat: Mucous membranes are moist. Oropharynx  non-erythematous. Neck: No stridor.   Cardiovascular: Normal rate, regular rhythm. Good peripheral circulation. Grossly normal heart sounds. Respiratory: Normal respiratory effort.  No retractions. Lungs CTAB. Gastrointestinal: Soft and nontender. No distention.  Musculoskeletal: No lower extremity tenderness nor edema. No gross deformities of extremities. Neurologic:  Normal speech and language. No gross focal neurologic deficits are appreciated.  Skin:  Skin is warm, dry and intact. No rash noted. Psychiatric: Mood and affect are normal. Speech and behavior are normal.  ____________________________________________   LABS (all labs ordered are listed, but only abnormal results are  displayed)  Labs Reviewed  CBC - Abnormal; Notable for the following components:      Result Value   WBC 14.4 (*)    RBC 2.64 (*)    Hemoglobin 6.7 (*)    HCT 20.9 (*)    MCV 79.3 (*)    MCH 25.3 (*)    MCHC 31.9 (*)    RDW 15.3 (*)    All other components within normal limits  COMPREHENSIVE METABOLIC PANEL - Abnormal; Notable for the following components:   Sodium 132 (*)    Chloride 98 (*)    Glucose, Bld 116 (*)    Calcium 8.3 (*)    Albumin 3.3 (*)    AST 277 (*)    ALT 112 (*)    Alkaline Phosphatase 257 (*)    GFR calc non Af Amer 56 (*)    All other components within normal limits  URINALYSIS, COMPLETE (UACMP) WITH MICROSCOPIC - Abnormal; Notable for the following components:   Color, Urine YELLOW (*)    APPearance CLEAR (*)    Squamous Epithelial / LPF 0-5 (*)    All other components within normal limits  URINE CULTURE  CULTURE, BLOOD (ROUTINE X 2)  CULTURE, BLOOD (ROUTINE X 2)  PROCALCITONIN  PREPARE RBC (CROSSMATCH)  TYPE AND SCREEN     RADIOLOGY I, [place] N Pang Robers, personally viewed and evaluated these images (plain radiographs) as part of my medical decision making, as well as reviewing the written report by the radiologist.  ED MD interpretation:New  dense opacity left lung base concerning for pneumonia per radiologist.  Official radiology report(s): Dg Chest Portable 1 View  Result Date: 04/26/2017 CLINICAL DATA:  Former smoker, now with fever, possible UTI, AMS EXAM: PORTABLE CHEST 1 VIEW COMPARISON:  Chest x-ray dated 11/07/2012. FINDINGS: Study is hypoinspiratory. Dense opacity at the LEFT lung base, obscuring the LEFT hemidiaphragm and portion of the LEFT heart. Probable small LEFT pleural effusion. No pneumothorax seen. Aortic atherosclerosis. No acute or suspicious osseous finding. IMPRESSION: 1. New dense opacity at the LEFT lung base, highly suspicious for pneumonia given the history of fever, alternatively atelectasis. 2. Probable small LEFT  pleural effusion. 3. Probable cardiomegaly, difficult to characterize due to the obscuring opacity at the LEFT lung base. 4. Aortic atherosclerosis. Electronically Signed   By: Franki Cabot M.D.   On: 04/26/2017 11:41      .Critical Care Performed by: Gregor Hams, MD Authorized by: Gregor Hams, MD   Critical care provider statement:    Critical care time (minutes):  30   Critical care time was exclusive of:  Separately billable procedures and treating other patients and teaching time   Critical care was time spent personally by me on the following activities:  Development of treatment plan with patient or surrogate, discussions with consultants, evaluation of patient's response to treatment, examination of patient, obtaining history from patient or surrogate, ordering and performing  treatments and interventions, ordering and review of laboratory studies, ordering and review of radiographic studies, pulse oximetry, re-evaluation of patient's condition and review of old charts   I assumed direction of critical care for this patient from another provider in my specialty: no       ____________________________________________   INITIAL IMPRESSION / ASSESSMENT AND PLAN / ED COURSE  As part of my medical decision making, I reviewed the following data within the electronic MEDICAL RECORD NUMBER   84 year old female presenting with above stated history of physical exam concerning for infectious etiology including pneumonia or urinary tract infection etc. Also given pale conjunctiva concern for possible anemia which was confirmed on laboratory data revealed a hemoglobin of 6.7comparison hemoglobin on 09/03/2016 was 10.9. Patient does admit to generalized abdominal discomfort and a such a CT scan of the abdomen was performed which revealed metastatic adenocarcinoma. Given patient's anemia 2 units packed red blood cells was administered. Patient discussed with hospital staff for [facility]  further evaluation and management ____________________________________________  FINAL CLINICAL IMPRESSION(S) / ED DIAGNOSES  Final diagnoses:  Community acquired pneumonia of left lower lobe of lung ([place])  Anemia, unspecified type  metastatic adenocarcinoma   MEDICATIONS GIVEN DURING THIS VISIT:  Medications  0.9 %  sodium chloride infusion (has no administration in time range)  cefTRIAXone (ROCEPHIN) 1 g in sodium chloride 0.9 % 100 mL IVPB (has no administration in time range)  cefTRIAXone (ROCEPHIN) 1 g in sodium chloride 0.9 % 100 mL IVPB (0 g Intravenous Stopped 04/26/17 1322)  azithromycin (ZITHROMAX) 500 mg in sodium chloride 0.9 % 250 mL IVPB (0 mg Intravenous Stopped 04/26/17 1428)  iopamidol (ISOVUE-300) 61 % injection 30 mL (30 mLs Oral Contrast Given 04/26/17 1327)     ED Discharge Orders    None       Note:  This document was prepared using Dragon voice recognition software and may include unintentional dictation errors.    Gregor Hams, MD 04/29/17 [phone]

## 2017-04-26 NOTE — Progress Notes (Signed)
Advanced Care Plan.  Purpose of Encounter: CODE STATUS. Parties in Attendance: The patient, her daughter and me. Patient's Decisional Capacity: No. Medical Story: Christine [name] is a 84 y.o. female with below list of chronic medical conditions including dementia, hypertension, hyperlipidemia, osteoarthritis, peripheral edema and aortic insufficiency.  The patient is sent to ED due to fever and confusion, found pneumonia and anemia.  I discussed with the patient's daughter about the patient condition, poor prognosis, CODE STATUS and possible palliative care.  The patient's daughter hesitated to make decision about saying full code for now.  Plan:  Code Status: DNR. Time spent discussing advance care planning: 18 minutes.

## 2017-04-26 NOTE — H&P (Signed)
[facility] [place] NAME: Christine [name]    MR#:  [id number]  DATE OF BIRTH:  04/12/1932  DATE OF ADMISSION:  04/26/2017  PRIMARY CARE PHYSICIAN: Madelyn Brunner, MD   REQUESTING/REFERRING PHYSICIAN: Dr. Owens Shark.  CHIEF COMPLAINT:   Chief Complaint  Patient presents with  . Fever  . Urinary Tract Infection   Fever and confusion since yesterday HISTORY OF PRESENT ILLNESS:  Christine [name]  is a 84 y.o. female with a known history of multiple medical problems as below.  The patient is sent from home to ED due to above chief complaints.  The patient is demented, unable to provide any information.  Per her daughter, she noticed that the patient has fever 102 and more confused.  She also complains of her abdominal pain for a period of time.  Chest x-ray showed left base pneumonia.  Hemoglobin was 10.9 last August but decreased to 6.7 today.  Per her daughter, she did not notice any melena or bloody stool.  PAST MEDICAL HISTORY:   Past Medical History:  Diagnosis Date  . Aortic insufficiency   . Carpal tunnel syndrome, bilateral   . Cellulitis of left index finger   . Dementia   . GERD (gastroesophageal reflux disease)   . H. pylori infection   . HLD (hyperlipidemia)   . Hypertension   . Hyperthyroidism   . Mild cognitive impairment   . Osteoarthritis   . Peripheral edema   . Vitamin B 12 deficiency     PAST SURGICAL HISTORY:   Past Surgical History:  Procedure Laterality Date  . CARPAL TUNNEL RELEASE Bilateral   . TOTAL KNEE ARTHROPLASTY Bilateral     SOCIAL HISTORY:   Social History   Tobacco Use  . Smoking status: Former Research scientist (life sciences)  . Smokeless tobacco: Never Used  Substance Use Topics  . Alcohol use: No    Alcohol/week: 0.0 oz    FAMILY HISTORY:   Family History  Problem Relation Age of Onset  . Stroke Mother   . Alzheimer's disease Father   . Diabetes Sister     DRUG ALLERGIES:   Allergies    Allergen Reactions  . Sulfa Antibiotics Other (See Comments)    Reaction: unknown Patient states no allergies, but family states allergy  . Penicillins Rash and Other (See Comments)    Has patient had a PCN reaction causing immediate rash, facial/tongue/throat swelling, SOB or lightheadedness with hypotension: Unknown Has patient had a PCN reaction causing severe rash involving mucus membranes or skin necrosis: Unknown Has patient had a PCN reaction that required hospitalization: Unknown Has patient had a PCN reaction occurring within the last 10 years: Unknown If all of the above answers are "NO", then may proceed with Cephalosporin use.  PT says no allergy - family says rash     REVIEW OF SYSTEMS:   Review of Systems  Unable to perform ROS: Dementia    MEDICATIONS AT HOME:   Prior to Admission medications   Medication Sig Start Date End Date Taking? Authorizing Provider  furosemide (LASIX) 20 MG tablet Take 1 tablet by mouth daily. 04/15/17  Yes [provider]  QUEtiapine (SEROQUEL) 25 MG tablet Take 1 tablet by mouth every evening. 02/13/17 08/12/17 Yes [provider]      VITAL SIGNS:  Blood pressure (!) 150/70, pulse 82, temperature 98.9 F (37.2 C), temperature source Oral, resp. rate 20, height 5\' 6"  (1.676 m), weight 200 lb (90.7 kg),  SpO2 94 %.  PHYSICAL EXAMINATION:  Physical Exam  GENERAL:  84 y.o.-year-old patient lying in the bed with no acute distress.  Demented. EYES: Pupils equal, round, reactive to light and accommodation. mild scleral icterus. Extraocular muscles intact.  HEENT: Head atraumatic, normocephalic. Oropharynx and nasopharynx clear.  NECK:  Supple, no jugular venous distention. No thyroid enlargement, no tenderness.  LUNGS: Normal breath sounds bilaterally, no wheezing, rales,rhonchi or crepitation. No use of accessory muscles of respiration.  CARDIOVASCULAR: S1, S2 normal. No murmurs, rubs, or gallops.  ABDOMEN: Soft,  nontender, nondistended. Bowel sounds present. No organomegaly or mass.  EXTREMITIES: Bilateral leg and foot edema, no cyanosis, or clubbing.  NEUROLOGIC: Cranial nerves II through XII are intact. Muscle strength 3-4/5 in all extremities. Sensation intact. Gait not checked.  PSYCHIATRIC: The patient is demented.Marland Kitchen  SKIN: No obvious rash, lesion, or ulcer.   LABORATORY PANEL:   CBC Recent Labs  Lab 04/26/17 1137  WBC 14.4*  HGB 6.7*  HCT 20.9*  PLT 323   ------------------------------------------------------------------------------------------------------------------  Chemistries  Recent Labs  Lab 04/26/17 1137  NA 132*  K 3.7  CL 98*  CO2 26  GLUCOSE 116*  BUN 15  CREATININE 0.93  CALCIUM 8.3*  AST 277*  ALT 112*  ALKPHOS 257*  BILITOT 1.2   ------------------------------------------------------------------------------------------------------------------  Cardiac Enzymes No results for input(s): TROPONINI in the last 168 hours. ------------------------------------------------------------------------------------------------------------------  RADIOLOGY:  Dg Chest Portable 1 View  Result Date: 04/26/2017 CLINICAL DATA:  Former smoker, now with fever, possible UTI, AMS EXAM: PORTABLE CHEST 1 VIEW COMPARISON:  Chest x-ray dated 11/07/2012. FINDINGS: Study is hypoinspiratory. Dense opacity at the LEFT lung base, obscuring the LEFT hemidiaphragm and portion of the LEFT heart. Probable small LEFT pleural effusion. No pneumothorax seen. Aortic atherosclerosis. No acute or suspicious osseous finding. IMPRESSION: 1. New dense opacity at the LEFT lung base, highly suspicious for pneumonia given the history of fever, alternatively atelectasis. 2. Probable small LEFT pleural effusion. 3. Probable cardiomegaly, difficult to characterize due to the obscuring opacity at the LEFT lung base. 4. Aortic atherosclerosis. Electronically Signed   By: Franki Cabot M.D.   On: 04/26/2017 11:41       IMPRESSION AND PLAN:   Pneumonia, CAP with leukocytosis. The patient will be admitted to medical floor. Start antibiotics including Zithromax and Rocephin, follow-up cultures.  Robitussin as needed.  Anemia of chronic disease.  Unclear etiology. PRBC transfusion, anemia workup and a GI consult.  Hyponatremia.  Start normal saline IV in the follow-up BMP.  Abnormal liver function test.  Unclear etiology.  Follow-up liver function test, CT abodomen and GI consult.   Bilateral leg edema.  Unclear etiology.  Venous duplex.  Dementia.  Aspiration precaution and fall precaution.   All the records are reviewed and case discussed with ED provider. Management plans discussed with the patient's daughter [facility]) and they are in agreement.  CODE STATUS: Full code for now.  TOTAL TIME TAKING CARE OF THIS PATIENT: 55 minutes.    Demetrios Loll M.D on 04/26/2017 at 3:31 PM  Between 7am to 6pm - Pager - [phone]  After 6pm go to [email or web address] - Proofreader  [facility]  Office  [phone]  CC: Primary care physician; Madelyn Brunner, MD   Note: This dictation was prepared with Dragon dictation along with smaller phrase technology. Any transcriptional errors that result from this process are unin

## 2017-04-26 NOTE — Progress Notes (Signed)
Pharmacy Antibiotic Note  Christine [name] is a 84 y.o. female admitted on 04/26/2017 with pneumonia/CAP.  Pharmacy has been consulted for ceftriaxone dosing.  Plan: Ceftriaxone 1g IV q24h   Height: 5\' 6"  (167.6 cm) Weight: 200 lb (90.7 kg) IBW/kg (Calculated) : 59.3  Temp (24hrs), Avg:100 F (37.8 C), Min:100 F (37.8 C), Max:100 F (37.8 C)  Recent Labs  Lab 04/26/17 1137  WBC 14.4*  CREATININE 0.93    Estimated Creatinine Clearance: 52.9 mL/min (by C-G formula based on SCr of 0.93 mg/dL).    Allergies  Allergen Reactions  . Sulfa Antibiotics Other (See Comments)    Reaction: unknown Patient states no allergies, but family states allergy  . Penicillins Rash and Other (See Comments)    Has patient had a PCN reaction causing immediate rash, facial/tongue/throat swelling, SOB or lightheadedness with hypotension: Unknown Has patient had a PCN reaction causing severe rash involving mucus membranes or skin necrosis: Unknown Has patient had a PCN reaction that required hospitalization: Unknown Has patient had a PCN reaction occurring within the last 10 years: Unknown If all of the above answers are "NO", then may proceed with Cephalosporin use.  PT says no allergy - family says rash     Antimicrobials this admission: 4/14 Azithromycin >> once 4/14 Ceftriaxone >>   Dose adjustments this admission:   Microbiology results: 4/14 BCx: sent 4/14 UCx: sent  Thank you for allowing pharmacy to be a part of this patient's care.  Candelaria Stagers, PharmD Pharmacy Resident  04/26/2017 1:52 PM

## 2017-04-26 NOTE — ED Triage Notes (Signed)
Pt arrives via ems from home with reports of altered mental status, possible uti, and oral temp of 101. Ems report a hx of dementia. Pt currently oriented to self. NAD noted at this time.

## 2017-04-27 DIAGNOSIS — R112 Nausea with vomiting, unspecified: Secondary | ICD-10-CM

## 2017-04-27 DIAGNOSIS — R4182 Altered mental status, unspecified: Secondary | ICD-10-CM

## 2017-04-27 DIAGNOSIS — Z87891 Personal history of nicotine dependence: Secondary | ICD-10-CM

## 2017-04-27 DIAGNOSIS — C787 Secondary malignant neoplasm of liver and intrahepatic bile duct: Secondary | ICD-10-CM

## 2017-04-27 DIAGNOSIS — E538 Deficiency of other specified B group vitamins: Secondary | ICD-10-CM

## 2017-04-27 DIAGNOSIS — R509 Fever, unspecified: Secondary | ICD-10-CM

## 2017-04-27 DIAGNOSIS — I959 Hypotension, unspecified: Secondary | ICD-10-CM

## 2017-04-27 DIAGNOSIS — D649 Anemia, unspecified: Secondary | ICD-10-CM

## 2017-04-27 DIAGNOSIS — J189 Pneumonia, unspecified organism: Secondary | ICD-10-CM

## 2017-04-27 LAB — BLOOD CULTURE ID PANEL (REFLEXED)
ACINETOBACTER BAUMANNII: NOT DETECTED
CANDIDA ALBICANS: NOT DETECTED
CANDIDA GLABRATA: NOT DETECTED
CANDIDA KRUSEI: NOT DETECTED
Candida parapsilosis: NOT DETECTED
Candida tropicalis: NOT DETECTED
Carbapenem resistance: NOT DETECTED
ENTEROCOCCUS SPECIES: NOT DETECTED
ESCHERICHIA COLI: NOT DETECTED
Enterobacter cloacae complex: NOT DETECTED
Enterobacteriaceae species: DETECTED — AB
Haemophilus influenzae: NOT DETECTED
KLEBSIELLA OXYTOCA: NOT DETECTED
Klebsiella pneumoniae: NOT DETECTED
LISTERIA MONOCYTOGENES: NOT DETECTED
Neisseria meningitidis: NOT DETECTED
PSEUDOMONAS AERUGINOSA: NOT DETECTED
Proteus species: NOT DETECTED
SERRATIA MARCESCENS: NOT DETECTED
STAPHYLOCOCCUS SPECIES: NOT DETECTED
STREPTOCOCCUS PNEUMONIAE: NOT DETECTED
STREPTOCOCCUS PYOGENES: NOT DETECTED
Staphylococcus aureus (BCID): NOT DETECTED
Streptococcus agalactiae: NOT DETECTED
Streptococcus species: NOT DETECTED

## 2017-04-27 LAB — CBC
HEMATOCRIT: 22.9 % — AB (ref 35.0–47.0)
Hemoglobin: 8.1 g/dL — ABNORMAL LOW (ref 12.0–16.0)
MCH: 28.3 pg (ref 26.0–34.0)
MCHC: 35.2 g/dL (ref 32.0–36.0)
MCV: 80.3 fL (ref 80.0–100.0)
Platelets: 262 10*3/uL (ref 150–440)
RBC: 2.85 MIL/uL — ABNORMAL LOW (ref 3.80–5.20)
RDW: 15.8 % — AB (ref 11.5–14.5)
WBC: 8.4 10*3/uL (ref 3.6–11.0)

## 2017-04-27 LAB — HEPATIC FUNCTION PANEL
ALT: 66 U/L — ABNORMAL HIGH (ref 14–54)
AST: 114 U/L — ABNORMAL HIGH (ref 15–41)
Albumin: 2.7 g/dL — ABNORMAL LOW (ref 3.5–5.0)
Alkaline Phosphatase: 190 U/L — ABNORMAL HIGH (ref 38–126)
Bilirubin, Direct: 1 mg/dL — ABNORMAL HIGH (ref 0.1–0.5)
Indirect Bilirubin: 0.9 mg/dL (ref 0.3–0.9)
TOTAL PROTEIN: 5.7 g/dL — AB (ref 6.5–8.1)
Total Bilirubin: 1.9 mg/dL — ABNORMAL HIGH (ref 0.3–1.2)

## 2017-04-27 LAB — BASIC METABOLIC PANEL
Anion gap: 7 (ref 5–15)
BUN: 15 mg/dL (ref 6–20)
CHLORIDE: 101 mmol/L (ref 101–111)
CO2: 25 mmol/L (ref 22–32)
Calcium: 7.5 mg/dL — ABNORMAL LOW (ref 8.9–10.3)
Creatinine, Ser: 1.08 mg/dL — ABNORMAL HIGH (ref 0.44–1.00)
GFR calc Af Amer: 54 mL/min — ABNORMAL LOW (ref >60)
GFR calc non Af Amer: 46 mL/min — ABNORMAL LOW (ref >60)
GLUCOSE: 84 mg/dL (ref 65–99)
POTASSIUM: 3.7 mmol/L (ref 3.5–5.1)
Sodium: 133 mmol/L — ABNORMAL LOW (ref 135–145)

## 2017-04-27 LAB — URINE CULTURE: CULTURE: NO GROWTH

## 2017-04-27 LAB — STREP PNEUMONIAE URINARY ANTIGEN: STREP PNEUMO URINARY ANTIGEN: NEGATIVE

## 2017-04-27 MED ORDER — MEROPENEM 1 G IV SOLR
1.0000 g | Freq: Three times a day (TID) | INTRAVENOUS | Status: DC
  Administered 2017-04-27 – 2017-04-28 (×4): 1 g via INTRAVENOUS
  Filled 2017-04-27 (×6): qty 1

## 2017-04-27 NOTE — Progress Notes (Signed)
[facility] [place] NAME: Kenyette Gundy    MR#:  [id number]  DATE OF BIRTH:  06/25/1932  SUBJECTIVE:  CHIEF COMPLAINT:   Chief Complaint  Patient presents with  . Fever  . Urinary Tract Infection  very edematous, weak, barely able to move out of bed, not rehab appropriate, daughters at bedside REVIEW OF SYSTEMS:  Review of Systems  Constitutional: Positive for fever and malaise/fatigue. Negative for chills.  HENT: Negative for nosebleeds and sore throat.   Eyes: Negative for blurred vision.  Respiratory: Negative for cough, shortness of breath and wheezing.   Cardiovascular: Positive for leg swelling. Negative for chest pain, orthopnea and PND.  Gastrointestinal: Negative for abdominal pain, constipation, diarrhea, heartburn, nausea and vomiting.  Genitourinary: Negative for dysuria and urgency.  Musculoskeletal: Negative for back pain.  Skin: Negative for rash.  Neurological: Positive for weakness. Negative for dizziness, speech change, focal weakness and headaches.  Endo/Heme/Allergies: Does not bruise/bleed easily.  Psychiatric/Behavioral: Negative for depression.    DRUG ALLERGIES:   Allergies  Allergen Reactions  . Sulfa Antibiotics Other (See Comments)    Reaction: unknown Patient states no allergies, but family states allergy  . Penicillins Rash and Other (See Comments)    Has patient had a PCN reaction causing immediate rash, facial/tongue/throat swelling, SOB or lightheadedness with hypotension: Unknown Has patient had a PCN reaction causing severe rash involving mucus membranes or skin necrosis: Unknown Has patient had a PCN reaction that required hospitalization: Unknown Has patient had a PCN reaction occurring within the last 10 years: Unknown If all of the above answers are "NO", then may proceed with Cephalosporin use.  PT says no allergy - family says rash    VITALS:  Blood pressure (!) 130/59, pulse 69,  temperature 99.5 F (37.5 C), temperature source Oral, resp. rate 18, height 5\' 6"  (1.676 m), weight 90.7 kg (200 lb), SpO2 99 %. PHYSICAL EXAMINATION:  Physical Exam  Constitutional: She is oriented to person, place, and time. She appears toxic. She has a sickly appearance.  HENT:  Head: Normocephalic and atraumatic.  Eyes: Pupils are equal, round, and reactive to light. Conjunctivae and EOM are normal.  Neck: Normal range of motion. Neck supple. No tracheal deviation present. No thyromegaly present.  Cardiovascular: Normal rate, regular rhythm and normal heart sounds.  Pulmonary/Chest: Effort normal and breath sounds normal. No respiratory distress. She has no wheezes. She exhibits no tenderness.  Abdominal: Soft. Bowel sounds are normal. She exhibits no distension. There is no tenderness.  Musculoskeletal: Normal range of motion.  Neurological: She is alert and oriented to person, place, and time. No cranial nerve deficit.  Skin: Skin is warm and dry. No rash noted.   LABORATORY PANEL:  Female CBC Recent Labs  Lab 04/27/17 0807  WBC 8.4  HGB 8.1*  HCT 22.9*  PLT 262   ------------------------------------------------------------------------------------------------------------------ Chemistries  Recent Labs  Lab 04/27/17 0807 04/27/17 1450  NA 133*  --   K 3.7  --   CL 101  --   CO2 25  --   GLUCOSE 84  --   BUN 15  --   CREATININE 1.08*  --   CALCIUM 7.5*  --   AST  --  114*  ALT  --  66*  ALKPHOS  --  190*  BILITOT  --  1.9*   RADIOLOGY:  No results found. ASSESSMENT AND PLAN:   * Sepsis: present on admission due to Pneumonia, CAP  with leukocytosis. - febrile, hypotensive - blood c/s growing GNR - will c/s ID - continue Zithromax and Rocephin, follow-up cultures.  Robitussin as needed.  * Liver lesions: ?Mets - will c/s onco for further eval  * Anemia of chronic disease.  Unclear etiology. Hb 8.1 today - s/p 2 PRBC transfusion, anemia workup in progress -  GI would like to wait for EGD, await onco input  * Hyponatremia: improving with IVF  * Abnormal liver function test: recheck and monitor - could be from passive congestion - Follow-up liver function test  * Bilateral leg edema: likely due to poor circulation and poor nutrition. [place] neg for DVT  * Dementia.  Aspiration precaution and fall precaution.    Overall poor prognosis. Await Palliative care cs. HHPT, not STR/SNF appropriate   All the records are reviewed and case discussed with Care Management/Social Worker. Management plans discussed with the patient, family (daughters) and they are in agreement.  CODE STATUS: DNR  TOTAL TIME TAKING CARE OF THIS PATIENT: 25 minutes.   More than 50% of the time was spent in counseling/coordination of care: YES  POSSIBLE D/C IN 2-3 DAYS, DEPENDING ON CLINICAL CONDITION.   Max Sane M.D on 04/27/2017 at 7:19 PM  Between 7am to 6pm - Pager - [phone]  After 6pm go to [email or web address] - Proofreader  [facility]  Office  [phone]  CC: Primary care physician; Baxter Hire, MD  Note: This dictation was prepared with Dragon dictation along with smaller phrase technology. Any transcriptional errors that result from this process are unintentional.

## 2017-04-27 NOTE — Evaluation (Addendum)
Physical Therapy Evaluation Patient Details Name: Christine [name] MRN: [id number] DOB: 06/07/1932 Today's Date: 04/27/2017   History of Present Illness  Patient is an 84 year old female admitted from home with pneumonia following c/o fever and UTI.  PMH includes peripheral edema, OA, hyperthyroidism, Htn, HLD, dementia, GERD, cellulitis of L finger and carpal tunnel.  Clinical Impression  Pt is an 84 year old female who lives in a one story home with her daughter.  She requires a RW for mobility at baseline but daughter reports that pt's mobility has been very limited for weeks to months now due to repeated falls and fear of falling.  Pt is in bed upon PT arrival and reports no pain at rest.  Bilateral LE's appear to be very swollen and pt reports pain to the touch and with mobility.  She is unable to lift LE's against gravity and bilateral UE's are generally weak.  Pt requires max A for bed mobility and is able to assist with STS using RW, though she is very fearful.  Pt repeated, "I can't get out of this bed" throughout bed mobility.  Pt attempted to advance L LE to ambulate and was unable to, repeating that she was not able to "get to that chair".  PT and NA assisted pt back into bed and with scooting up in bed, total A.  Pt able to follow simple commands intermittently but very limited by fear.  Pt will continue to benefit from skilled PT with focus on strength, functional mobility, tolerance to activity, pain management and balance.  Patient suffers from dementia, OA and severe peripheral edema and which impair her ability to perform daily activities like toileting, feeding, dressing, grooming, bathing in the home. A walker will not resolve the patient's issue with performing activities of daily living. A wheelchair is required/recommended and will allow patient to safely perform daily activities.  ADDENDED 04/27/2017, 1642   Patient can safely propel the wheelchair in the home or has a caregiver  who can provide assistance.      Follow Up Recommendations Home health PT;Supervision/Assistance - 24 hour;Supervision for mobility/OOB    Equipment Recommendations   Wheelchair (Measurements PT) Addended 04/27/2017 1700    Recommendations for Other Services       Precautions / Restrictions Precautions Precautions: Fall Precaution Comments: High Fall risk Restrictions Weight Bearing Restrictions: No      Mobility  Bed Mobility Overal bed mobility: Needs Assistance Bed Mobility: Rolling;Supine to Sit;Sit to Supine Rolling: +2 for physical assistance;Max assist   Supine to sit: +2 for physical assistance;Max assist Sit to supine: +2 for physical assistance;Max assist   General bed mobility comments: Pt attempted in assisting with some mobility but repeated "I can't get out of this bed" and stating that her legs were aching.  Pt also required min-mod A for upright posture when sitting at EOB.  PT and NA provided total A to slide pt up in bed.  Transfers Overall transfer level: Needs assistance Equipment used: Rolling walker (2 wheeled) Transfers: Sit to/from Stand Sit to Stand: +2 physical assistance;Max assist         General transfer comment: Pt able to use LE's to assist in standing but "freezes" once she is up, in fear of falling.  Pt repeats, "I can't get in that chair."  Ambulation/Gait Ambulation/Gait assistance: Max assist Ambulation Distance (Feet): 1 Feet         General Gait Details: Pt attempted ambulation but was unable to advance L  LE and advanced R LE slightly forward in a sliding motion with great effort.  Pt is very fearful of falling and gripped RW tightly, repeating that she wasn't able to walk.  Stairs            Wheelchair Mobility    Modified Rankin (Stroke Patients Only)       Balance Overall balance assessment: Needs assistance Sitting-balance support: Bilateral upper extremity supported;Feet supported   Sitting balance -  Comments: min-mod A for support of upright posture. Postural control: Posterior lean Standing balance support: Bilateral upper extremity supported                                 Pertinent Vitals/Pain Pain Assessment: Faces Faces Pain Scale: Hurts little more Pain Location: Denies pain at rest but experiences LE pain to the touch and with bed mobility. Pain Intervention(s): Limited activity within patient's tolerance;Monitored during session    [place] expects to be discharged to:: Private residence Living Arrangements: Children Available Help at Discharge: Family;Available PRN/intermittently(Pt concerned about 24 hr care) Type of Home: House Home Access: Stairs to enter Entrance Stairs-Rails: Can reach both Entrance Stairs-Number of Steps: 5 Home Layout: One level Home Equipment: Walker - 2 wheels      Prior Function Level of Independence: Needs assistance   Gait / Transfers Assistance Needed: Pt's daughter states that pt was using a RW for ambulation but that she has seen a significant decline in mobility in the past few weeks to months.  ADL's / Homemaking Assistance Needed: Receives assistance with bathing and dressing        Hand Dominance        Extremity/Trunk Assessment   Upper Extremity Assessment Upper Extremity Assessment: Generalized weakness(Grossly 3/5 bilaterally)    Lower Extremity Assessment Lower Extremity Assessment: Generalized weakness(Bilateral grossly 3-/5 in supine)    Cervical / Trunk Assessment Cervical / Trunk Assessment: Kyphotic  Communication   Communication: No difficulties  Cognition Arousal/Alertness: Awake/alert Behavior During Therapy: Restless Overall Cognitive Status: History of cognitive impairments - at baseline                                 General Comments: Pt has a dx of dementia      General Comments      Exercises     Assessment/Plan    PT Assessment Patient  needs continued PT services  PT Problem List Decreased strength;Decreased mobility;Decreased balance;Decreased knowledge of use of DME;Decreased activity tolerance;Decreased cognition       PT Treatment Interventions DME instruction;Therapeutic activities;Gait training;Therapeutic exercise;Cognitive remediation;Patient/family education;Stair training;Balance training;Functional mobility training;Neuromuscular re-education    PT Goals (Current goals can be found in the Care Plan section)  Acute Rehab PT Goals PT Goal Formulation: Patient unable to participate in goal setting    Frequency Min 2X/week   Barriers to discharge        Co-evaluation               AM-PAC PT "6 Clicks" Daily Activity  Outcome Measure Difficulty turning over in bed (including adjusting bedclothes, sheets and blankets)?: A Lot Difficulty moving from lying on back to sitting on the side of the bed? : A Lot Difficulty sitting down on and standing up from a chair with arms (e.g., wheelchair, bedside commode, etc,.)?: A Lot Help needed moving to and from a bed  to chair (including a wheelchair)?: Total Help needed walking in hospital room?: Total Help needed climbing 3-5 steps with a railing? : Total 6 Click Score: 9    End of Session Equipment Utilized During Treatment: Gait belt Activity Tolerance: Patient limited by pain;Patient limited by fatigue;Other (comment)(Pt limited by fear of falling and difficulty following directions cognitively.) Patient left: in bed;with call bell/phone within reach;with family/visitor present;with nursing/sitter in room Nurse Communication: Mobility status PT Visit Diagnosis: Unsteadiness on feet (R26.81);Muscle weakness (generalized) (M62.81)    Time: 8022-3361 PT Time Calculation (min) (ACUTE ONLY): 28 min   Charges:   PT Evaluation $PT Eval Moderate Complexity: 1 Mod     PT G Codes:   PT G-Codes **NOT FOR INPATIENT CLASS** Functional Assessment Tool Used:  AM-PAC 6 Clicks Basic Mobility    Roxanne Gates, PT, DPT   Roxanne Gates 04/27/2017, 11:58 AM, Addended 04/27/2017, 1643, 1700

## 2017-04-27 NOTE — Progress Notes (Signed)
Chaplain met with patient and her niece Christine [name]. Patient was confused and kept repeating key phrases like, "Christine [name] has been so good to me." She does not know what is wrong with her. Chaplain offered pastoral presence, listening, and prayer. Christine [name] entered the room and Chaplain reminded her that visits can be facilitated by the unit secretary.

## 2017-04-27 NOTE — Progress Notes (Signed)
Family Meeting Note  Advance Directive:yes  Today a meeting took place with the Patient and Daughters at bedside.  The following clinical team members were present during this meeting:MD  The following were discussed:Patient's diagnosis: 88 y f with below medical issues admitted for weakness, anasarca, severe anemia, possible mets in liver, and sepsis   Aortic insufficiency    . Carpal tunnel syndrome, bilateral   . Cellulitis of left index finger   . Dementia   . GERD (gastroesophageal reflux disease)   . H. pylori infection   . HLD (hyperlipidemia)   . Hypertension   . Hyperthyroidism   . Mild cognitive impairment   . Osteoarthritis   . Peripheral edema   . Vitamin B 12 deficiency      Patient's progosis: < 12 months and Goals for treatment: DNR  Additional follow-up to be provided: Palliative care eval, may be Hospice appropriate at Home  Time spent during discussion:20 minutes  Max Sane, MD

## 2017-04-27 NOTE — Progress Notes (Signed)
[facility]  Date of admission:  04/26/2017  Inpatient day:  04/27/2017  Consulting physician: Dr. Max Sane   Reason for Consultation:  ? Liver Mets?  Chief Complaint: Christine [name] is a 84 y.o. female who was admitted through the emergency room with anemia and pneumonia.  HPI: The patient's daughter provides the medical history.  The patient lives with her daughter.  Patient was diagnosed with dementia in 2018.  She is followed by Dr. Manuella [name].  Performance status is poor.  She "sits all the time" and rests.  She requires help with all of her activities of daily living.  She has a beside commode.  She "hardly walks".  The patient was in her usual state of health until 04/25/2017.  She developed nausea and vomiting that evening.  She complained of stomach pain.  She became "unresponsive" on 04/26/2017 and EMS was called.  She had a fever of 102, altered mental status, and low blood pressure.  CXR revealed a dense opacity in the left lung base.  Initial CBC revealed a hematocrit of 20.9, hemoglobin 6.7, and MCV 798.3.  CBC on 09/03/2017 revealed a hematocrit of 31.9 and hemoglobin 10.9.  Creatinine was 0.93.  Alkaline phosphatase was 257 (previously 81 in 08/2016).  AST was 277, ALT 112, and bilirubin 1.2.  Urinalysis was negative.  Ferritin was 24, iron saturation 2%, and TIBC 390 c/w iron deficiency anemia.    She was transfused with 2 units of PRBCs.  Hematocrit was 22.9 and hemoglobin 8.1 today.  Patient's diet is described as "good".  Her daughter denies any history of melena, hematochezia, hematuria, or vaginal bleeding.  Colonoscopy in 10/2015 revealed diverticulosis.  EGD in 102017 was normal.  She began B12 injections last year then oral B12.  Abdomen and pelvic CT on 04/26/2017 suggested possible ovarian cancer with solid and cystic masses in the pelvis, metastatic implants along the surface of the liver, in the omentum, and along the peritoneal surfaces.  There  were multiple small nodules in the liver.  There were epicardial nodes and suspicious nodes in the retroperitoneum.  L3 and L4 were heterogeneous.  There were small bilateral pleural effusions.  Blood cultures were + GNR (Enerobacteriacea species).  She is on meropenem.  Bilateral lower extremity duplex revealed no evidence of DVT.  She has been seen by Dr Christine [name].  Plan is for endoscopy.   Past Medical History:  Diagnosis Date  . Aortic insufficiency   . Carpal tunnel syndrome, bilateral   . Cellulitis of left index finger   . Dementia   . GERD (gastroesophageal reflux disease)   . H. pylori infection   . HLD (hyperlipidemia)   . Hypertension   . Hyperthyroidism   . Mild cognitive impairment   . Osteoarthritis   . Peripheral edema   . Vitamin B 12 deficiency     Past Surgical History:  Procedure Laterality Date  . CARPAL TUNNEL RELEASE Bilateral   . TOTAL KNEE ARTHROPLASTY Bilateral     Family History  Problem Relation Age of Onset  . Stroke Mother   . Alzheimer's disease Father   . Diabetes Sister     Social History:  reports that she has quit smoking. She has never used smokeless tobacco. She reports that she does not drink alcohol or use drugs.  She smoked "years ago".  The patient is accompanied by her daughter, Christine [name].  She lives withJanice.  Christine [name] is her medical power of attorney.  Allergies:  Allergies  Allergen Reactions  . Sulfa Antibiotics Other (See Comments)    Reaction: unknown Patient states no allergies, but family states allergy  . Penicillins Rash and Other (See Comments)    Has patient had a PCN reaction causing immediate rash, facial/tongue/throat swelling, SOB or lightheadedness with hypotension: Unknown Has patient had a PCN reaction causing severe rash involving mucus membranes or skin necrosis: Unknown Has patient had a PCN reaction that required hospitalization: Unknown Has patient had a PCN reaction occurring within the last 10 years:  Unknown If all of the above answers are "NO", then may proceed with Cephalosporin use.  PT says no allergy - family says rash     Facility-Administered Medications Prior to Admission  Medication Dose Route Frequency Provider Last Rate Last Dose  . 0.9 %  sodium chloride infusion  500 mL Intravenous Continuous Gatha Mayer, MD       Medications Prior to Admission  Medication Sig Dispense Refill  . furosemide (LASIX) 20 MG tablet Take 1 tablet by mouth daily.  10  . QUEtiapine (SEROQUEL) 25 MG tablet Take 1 tablet by mouth every evening.      Review of Systems: Information provided by patient's daughter. GENERAL:  Rests most of day.  Recent fever.  No sweats or weight loss. PERFORMANCE STATUS (ECOG):  3-4. HEENT:  No apparent visual changes, runny nose, sore throat, mouth sores or tenderness. Lungs: No apparent shortness of breath or cough.  No hemoptysis. Cardiac:  No apparent chest pain, palpitations, orthopnea, or PND. GI:  Recent 'stomach ache".  Constipation.  Nausea and vomiting on 04/25/2017.  No diarrhea, melena or hematochezia. GU:  No urgency, frequency, dysuria, or hematuria.  Uses bedside commode at home. Musculoskeletal:  No back pain.  Chronic left shoulder pain secondary to arthritis.  No muscle tenderness. Extremities:  Left leg discomfort.  Lower extremity swelling. Skin:  No rashes or skin changes. Neuro:  No headache, numbness or weakness, balance or coordination issues. Endocrine:  No diabetes, thyroid issues, hot flashes or night sweats. Psych:  No mood changes, depression or anxiety. Pain:  No focal pain. Review of systems:  All other systems reviewed and found to be negative.  Physical Exam:  Blood pressure (!) 130/59, pulse 69, temperature 99.5 F (37.5 C), temperature source Oral, resp. rate 18, height 5' 6"  (1.676 m), weight 200 lb (90.7 kg), SpO2 99 %.  GENERAL:  Elderly woman man lying comfortably on the medical unit in no acute distress. MENTAL  STATUS:  Alert and oriented to person and place.  Unaware of date. HEAD: Pearline Cables hair.  Normocephalic, atraumatic, face symmetric, no Cushingoid features. EYES:  Brown eyes.  Pupils equal round and reactive to light and accomodation.  No conjunctivitis or scleral icterus. ENT:  Oropharynx clear without lesion.  Tongue normal. Mucous membranes moist.  RESPIRATORY:  Clear to auscultation anteriorly without rales, wheezes or rhonchi. CARDIOVASCULAR:  Regular rate and rhythm without murmur, rub or gallop. ABDOMEN:  Soft, non-tender, with active bowel sounds, and no hepatosplenomegaly.  No masses. SKIN:  No rashes, ulcers or lesions. EXTREMITIES: Bilateral chronic lower extremity edema (left > right).  No skin discoloration or tenderness.  No palpable cords. LYMPH NODES: No palpable cervical, supraclavicular, axillary or inguinal adenopathy  NEUROLOGICAL: Open eyes and follows commands.  Answers questions appropriately.   Results for orders placed or performed during the hospital encounter of 04/26/17 (from the past 48 hour(s))  Blood culture (routine x 2)     Status: None (Preliminary result)  Collection Time: 04/26/17 11:29 AM  Result Value Ref Range   Specimen Description      BLOOD LEFT FOREARM Performed at [facility], [address] [place], [place] [address]    Special Requests      BOTTLES DRAWN AEROBIC AND ANAEROBIC Blood Culture adequate volume Performed at [facility], [place] [address] [place], [place] [address]    Culture  Setup Time      GRAM NEGATIVE RODS AEROBIC BOTTLE ONLY CRITICAL RESULT CALLED TO, READ BACK BY AND VERIFIED WITH: DAVID BESANTI AT 3818 04/27/17.PMH Performed at [facility], [place] [address], [place], [place] [address]    Culture GRAM NEGATIVE RODS    Report Status PENDING   Blood Culture ID Panel (Reflexed)     Status: Abnormal   Collection Time: 04/26/17 11:29 AM  Result Value Ref Range   Enterococcus species NOT DETECTED NOT  DETECTED   Listeria monocytogenes NOT DETECTED NOT DETECTED   Staphylococcus species NOT DETECTED NOT DETECTED   Staphylococcus aureus NOT DETECTED NOT DETECTED   Streptococcus species NOT DETECTED NOT DETECTED   Streptococcus agalactiae NOT DETECTED NOT DETECTED   Streptococcus pneumoniae NOT DETECTED NOT DETECTED   Streptococcus pyogenes NOT DETECTED NOT DETECTED   Acinetobacter baumannii NOT DETECTED NOT DETECTED   Enterobacteriaceae species DETECTED (A) NOT DETECTED    Comment: Enterobacteriaceae represent a large family of gram negative bacteria, not a single organism. Refer to culture for further identification. CRITICAL RESULT CALLED TO, READ BACK BY AND VERIFIED WITH: DAVID BESANTI AT 1696 04/27/17.PMH    Enterobacter cloacae complex NOT DETECTED NOT DETECTED   Escherichia coli NOT DETECTED NOT DETECTED   Klebsiella oxytoca NOT DETECTED NOT DETECTED   Klebsiella pneumoniae NOT DETECTED NOT DETECTED   Proteus species NOT DETECTED NOT DETECTED   Serratia marcescens NOT DETECTED NOT DETECTED   Carbapenem resistance NOT DETECTED NOT DETECTED   Haemophilus influenzae NOT DETECTED NOT DETECTED   Neisseria meningitidis NOT DETECTED NOT DETECTED   Pseudomonas aeruginosa NOT DETECTED NOT DETECTED   Candida albicans NOT DETECTED NOT DETECTED   Candida glabrata NOT DETECTED NOT DETECTED   Candida krusei NOT DETECTED NOT DETECTED   Candida parapsilosis NOT DETECTED NOT DETECTED   Candida tropicalis NOT DETECTED NOT DETECTED    Comment: Performed at [facility], [address] [place], [place] [address]  CBC     Status: Abnormal   Collection Time: 04/26/17 11:37 AM  Result Value Ref Range   WBC 14.4 (H) 3.6 - 11.0 K/uL   RBC 2.64 (L) 3.80 - 5.20 MIL/uL   Hemoglobin 6.7 (L) 12.0 - 16.0 g/dL   HCT 20.9 (L) 35.0 - 47.0 %   MCV 79.3 (L) 80.0 - 100.0 fL   MCH 25.3 (L) 26.0 - 34.0 pg   MCHC 31.9 (L) 32.0 - 36.0 g/dL   RDW 15.3 (H) 11.5 - 14.5 %   Platelets 323 150 - 440 K/uL     Comment: Performed at [facility], [address] [place], [place] [address]  Comprehensive metabolic panel     Status: Abnormal   Collection Time: 04/26/17 11:37 AM  Result Value Ref Range   Sodium 132 (L) 135 - 145 mmol/L   Potassium 3.7 3.5 - 5.1 mmol/L   Chloride 98 (L) 101 - 111 mmol/L   CO2 26 22 - 32 mmol/L   Glucose, Bld 116 (H) 65 - 99 mg/dL   BUN 15 6 - 20 mg/dL  Creatinine, Ser 0.93 0.44 - 1.00 mg/dL   Calcium 8.3 (L) 8.9 - 10.3 mg/dL   Total Protein 7.0 6.5 - 8.1 g/dL   Albumin 3.3 (L) 3.5 - 5.0 g/dL   AST 277 (H) 15 - 41 U/L   ALT 112 (H) 14 - 54 U/L   Alkaline Phosphatase 257 (H) 38 - 126 U/L   Total Bilirubin 1.2 0.3 - 1.2 mg/dL   GFR calc non Af Amer 56 (L) >60 mL/min   GFR calc Af Amer >60 >60 mL/min    Comment: (NOTE) The eGFR has been calculated using the CKD EPI equation. This calculation has not been validated in all clinical situations. eGFR's persistently <60 mL/min signify possible Chronic Kidney Disease.    Anion gap 8 5 - 15    Comment: Performed at [facility], [address] [place], [place] [address]  Urinalysis, Complete w Microscopic     Status: Abnormal   Collection Time: 04/26/17 11:37 AM  Result Value Ref Range   Color, Urine YELLOW (A) YELLOW   APPearance CLEAR (A) CLEAR   Specific Gravity, Urine 1.012 1.005 - 1.030   pH 7.0 5.0 - 8.0   Glucose, UA NEGATIVE NEGATIVE mg/dL   Hgb urine dipstick NEGATIVE NEGATIVE   Bilirubin Urine NEGATIVE NEGATIVE   Ketones, ur NEGATIVE NEGATIVE mg/dL   Protein, ur NEGATIVE NEGATIVE mg/dL   Nitrite NEGATIVE NEGATIVE   Leukocytes, UA NEGATIVE NEGATIVE   RBC / HPF 0-5 0 - 5 RBC/hpf   WBC, UA 0-5 0 - 5 WBC/hpf   Bacteria, UA NONE SEEN NONE SEEN   Squamous Epithelial / LPF 0-5 (A) NONE SEEN    Comment: Performed at [facility], [address], [place], [place] [address]  Urine culture     Status: None   Collection Time: 04/26/17 11:37 AM  Result Value Ref Range    Specimen Description      URINE, RANDOM Performed at [facility], [address] [place], [place] [address]    Special Requests      NONE Performed at [facility], [address] [place], [place] [address]    Culture      NO GROWTH Performed at [facility], [place] [address], [place], [place] [address]    Report Status 04/27/2017 FINAL   Strep pneumoniae urinary antigen     Status: None   Collection Time: 04/26/17 11:37 AM  Result Value Ref Range   Strep Pneumo Urinary Antigen NEGATIVE NEGATIVE    Comment:        Infection due to S. pneumoniae cannot be absolutely ruled out since the antigen present may be below the detection limit of the test. Performed at [facility], [place] [address], [place], [place] [address]   ABO/Rh     Status: None   Collection Time: 04/26/17 11:37 AM  Result Value Ref Range   ABO/RH(D)      A NEG Performed at [facility], [address] [place], [place] [address]   Blood culture (routine x 2)     Status: None (Preliminary result)   Collection Time: 04/26/17 11:45 AM  Result Value Ref Range   Specimen Description      BLOOD RIGHT ARM Performed at [facility], [address], [place],  [address]    Special Requests      BOTTLES DRAWN AEROBIC AND ANAEROBIC Blood Culture results may not be optimal due to an excessive volume of  blood received in culture bottles Performed at [facility], [place] [address] [place], [place] [address]    Culture  Setup Time      GRAM NEGATIVE RODS AEROBIC BOTTLE ONLY CRITICAL RESULT CALLED TO, READ BACK BY AND VERIFIED WITH: DAVID BESANTI AT 9628 04/27/17.PMH Performed at [facility], [address] [place], [place] [address]    Culture GRAM NEGATIVE RODS    Report Status PENDING   Prepare RBC     Status: None   Collection Time: 04/26/17  6:38 PM  Result Value Ref Range   Order Confirmation      ORDER PROCESSED BY BLOOD  BANK Performed at [facility], [address] [place], [place] [address]   Type and screen [place]     Status: None (Preliminary result)   Collection Time: 04/26/17  6:38 PM  Result Value Ref Range   ABO/RH(D) A NEG    Antibody Screen NEG    Sample Expiration      04/29/2017 Performed at [facility], [address], [place], [place] [address]    Unit Number [id number]    Blood Component Type RBC LR PHER1    Unit division 00    Status of Unit ISSUED,FINAL    Transfusion Status OK TO TRANSFUSE    Crossmatch Result Compatible    Unit Number [id number]    Blood Component Type RED CELLS,LR    Unit division 00    Status of Unit ISSUED    Transfusion Status OK TO TRANSFUSE    Crossmatch Result Compatible   Procalcitonin - Baseline     Status: None   Collection Time: 04/26/17  6:38 PM  Result Value Ref Range   Procalcitonin 14.68 ng/mL    Comment:        Interpretation: PCT >= 10 ng/mL: Important systemic inflammatory response, almost exclusively due to severe bacterial sepsis or septic shock. (NOTE)       Sepsis PCT Algorithm           Lower Respiratory Tract                                      Infection PCT Algorithm    ----------------------------     ----------------------------         PCT < 0.25 ng/mL                PCT < 0.10 ng/mL         Strongly encourage             Strongly discourage   discontinuation of antibiotics    initiation of antibiotics    ----------------------------     -----------------------------       PCT 0.25 - 0.50 ng/mL            PCT 0.10 - 0.25 ng/mL               OR       >80% decrease in PCT            Discourage initiation of                                            antibiotics      Encourage discontinuation  of antibiotics    ----------------------------     -----------------------------         PCT >= 0.50 ng/mL              PCT 0.26 - 0.50 ng/mL                AND        <80% decrease in PCT             Encourage initiation of                                             antibiotics       Encourage continuation           of antibiotics    ----------------------------     -----------------------------        PCT >= 0.50 ng/mL                  PCT > 0.50 ng/mL               AND         increase in PCT                  Strongly encourage                                      initiation of antibiotics    Strongly encourage escalation           of antibiotics                                     -----------------------------                                           PCT <= 0.25 ng/mL                                                 OR                                        > 80% decrease in PCT                                     Discontinue / Do not initiate                                             antibiotics Performed at [facility], [address] [place], [place] [address]   Iron and TIBC     Status: Abnormal   Collection Time: 04/26/17  6:38 PM  Result Value Ref Range   Iron 8 (L) 28 - 170 ug/dL  TIBC 390 250 - 450 ug/dL   Saturation Ratios 2 (L) 10.4 - 31.8 %   UIBC 382 ug/dL    Comment: Performed at [facility], [address] [place], [place] [address]  Ferritin     Status: None   Collection Time: 04/26/17  6:38 PM  Result Value Ref Range   Ferritin 24 11 - 307 ng/mL    Comment: Performed at [facility], [address] [place], [place] [address]  Basic metabolic panel     Status: Abnormal   Collection Time: 04/27/17  8:07 AM  Result Value Ref Range   Sodium 133 (L) 135 - 145 mmol/L   Potassium 3.7 3.5 - 5.1 mmol/L   Chloride 101 101 - 111 mmol/L   CO2 25 22 - 32 mmol/L   Glucose, Bld 84 65 - 99 mg/dL   BUN 15 6 - 20 mg/dL   Creatinine, Ser 1.08 (H) 0.44 - 1.00 mg/dL   Calcium 7.5 (L) 8.9 - 10.3 mg/dL   GFR calc non Af Amer 46 (L) >60 mL/min   GFR calc Af Amer 54 (L) >60 mL/min    Comment:  (NOTE) The eGFR has been calculated using the CKD EPI equation. This calculation has not been validated in all clinical situations. eGFR's persistently <60 mL/min signify possible Chronic Kidney Disease.    Anion gap 7 5 - 15    Comment: Performed at [facility], [address] [place], [place] [address]  CBC     Status: Abnormal   Collection Time: 04/27/17  8:07 AM  Result Value Ref Range   WBC 8.4 3.6 - 11.0 K/uL   RBC 2.85 (L) 3.80 - 5.20 MIL/uL   Hemoglobin 8.1 (L) 12.0 - 16.0 g/dL   HCT 22.9 (L) 35.0 - 47.0 %   MCV 80.3 80.0 - 100.0 fL   MCH 28.3 26.0 - 34.0 pg   MCHC 35.2 32.0 - 36.0 g/dL   RDW 15.8 (H) 11.5 - 14.5 %   Platelets 262 150 - 440 K/uL    Comment: Performed at [facility], [address] [place], [place] [address]  Hepatic function panel     Status: Abnormal   Collection Time: 04/27/17  2:50 PM  Result Value Ref Range   Total Protein 5.7 (L) 6.5 - 8.1 g/dL   Albumin 2.7 (L) 3.5 - 5.0 g/dL   AST 114 (H) 15 - 41 U/L   ALT 66 (H) 14 - 54 U/L   Alkaline Phosphatase 190 (H) 38 - 126 U/L   Total Bilirubin 1.9 (H) 0.3 - 1.2 mg/dL   Bilirubin, Direct 1.0 (H) 0.1 - 0.5 mg/dL   Indirect Bilirubin 0.9 0.3 - 0.9 mg/dL    Comment: Performed at [facility], [address] [place], [place] [address]   Ct Abdomen Pelvis W Contrast  Result Date: 04/26/2017 CLINICAL DATA:  Abdominal and pelvic pain.  Nausea and vomiting. EXAM: CT ABDOMEN AND PELVIS WITH CONTRAST TECHNIQUE: Multidetector CT imaging of the abdomen and pelvis was performed using the standard protocol following bolus administration of intravenous contrast. CONTRAST:  151m OMNIPAQUE IOHEXOL 300 MG/ML  SOLN COMPARISON:  None. FINDINGS: Lower chest: Prominent nodes in the epicardial region on series 2, image 7 are identified with a sample node measuring 2.6 by 1.3 cm. Cardiomegaly. Small bilateral pleural effusions with atelectasis. Hepatobiliary: Multiple small liver masses  identified. While some may represent cysts, I suspect several small metastases in the right and left hepatic  lobes. The gallbladder is mildly distended but otherwise normal. Portal vein is patent. Pancreas: Pancreas is normal. Spleen: Normal in size without focal abnormality. Adrenals/Urinary Tract: Renal cysts are identified. The kidneys and adrenal glands are otherwise normal. No renal obstruction. The bladder is unremarkable but decompressed with a Foley catheter. Stomach/Bowel: The stomach and small bowel is normal. No obstruction. The colon is normal. The appendix is not seen but there is no secondary evidence of appendicitis is identified. Vascular/Lymphatic: Atherosclerotic changes are seen in the nonaneurysmal aorta, iliac vessels, and femoral vessels. Abnormal epicardial nodes are identified as above. A few prominent para-aortic nodes are suspicious with a sample node on series 2, image 35 measuring 12 mm. Reproductive: Calcified fibroids are seen in the uterus which is deviated to the right. There are solid and cystic masses in the adnexa. The cystic component on the left measures 9.8 by 5.5 by 8.0 cm on coronal image 48 and axial image 58. Along the superior anterior aspect of this cystic lesion is a solid mass which may be separate or part of the cystic mass measuring up to 7.5 cm on series 5, image 31. There is a solid mass in the expected location of the right ovary which measures up to 4.6 cm on axial image 53. Other: Ascites is seen throughout the abdomen. Multiple metastatic implants are seen along the surface of the liver with a representative implant measuring up to 10 cm on coronal image 27. Omental/peritoneal disease is seen elsewhere including an implant anteriorly on series 2, image 48 measuring up to 4.9 cm. A fluid collection deep in the pelvis well seen on coronal image 64 demonstrates several peripheral nodular components, consistent with metastatic implants. Musculoskeletal: Suspected  metastatic disease to L4 and L3. No other bony metastatic disease identified. IMPRESSION: 1. Metastatic adenocarcinoma with metastatic implants along the surface of the liver, in the omentum, and along the peritoneal surfaces. Multiple small nodules in the liver are likely metastatic. Abnormal epicardial nodes are metastatic and there is suspicion for metastatic nodes in the retroperitoneum. Heterogeneity of L3 and L4 is suspicious for metastatic disease as well. Given the solid and cystic masses in the pelvis, I suspect the primary malignancy is most likely ovarian, probably on the left. A PET-CT may further evaluate for a more definitive site of primary malignancy if clinically indicated. 2. Small bilateral pleural effusions. 3. Atherosclerotic changes in the aorta and branching vessels. Electronically Signed   By: Dorise Bullion III M.D   On: 04/26/2017 16:38   US Venous Img Lower Bilateral  Result Date: 04/26/2017 CLINICAL DATA:  Leg swelling. EXAM: BILATERAL LOWER EXTREMITY VENOUS DOPPLER ULTRASOUND TECHNIQUE: Gray-scale sonography with graded compression, as well as color Doppler and duplex ultrasound were performed to evaluate the lower extremity deep venous systems from the level of the common femoral vein and including the common femoral, femoral, profunda femoral, popliteal and calf veins including the posterior tibial, peroneal and gastrocnemius veins when visible. The superficial great saphenous vein was also interrogated. Spectral Doppler was utilized to evaluate flow at rest and with distal augmentation maneuvers in the common femoral, femoral and popliteal veins. COMPARISON:  None. FINDINGS: RIGHT LOWER EXTREMITY Common Femoral Vein: No evidence of thrombus. Normal compressibility, respiratory phasicity and response to augmentation. Saphenofemoral Junction: No evidence of thrombus. Normal compressibility and flow on color Doppler imaging. Profunda Femoral Vein: No evidence of thrombus. Normal  compressibility and flow on color Doppler imaging. Femoral Vein: No evidence of thrombus. Normal compressibility, respiratory phasicity  and response to augmentation. Popliteal Vein: No evidence of thrombus. Normal compressibility, respiratory phasicity and response to augmentation. Calf Veins: Limited evaluation. Visualized right deep calf veins are patent without thrombus. LEFT LOWER EXTREMITY Common Femoral Vein: No evidence of thrombus. Normal compressibility, respiratory phasicity and response to augmentation. Saphenofemoral Junction: No evidence of thrombus. Normal compressibility and flow on color Doppler imaging. Profunda Femoral Vein: No evidence of thrombus. Normal compressibility and flow on color Doppler imaging. Femoral Vein: No evidence of thrombus. Normal compressibility, respiratory phasicity and response to augmentation. Popliteal Vein: No evidence of thrombus. Normal compressibility, respiratory phasicity and response to augmentation. Calf Veins: Limited evaluation. Visualized left deep calf veins are patent without thrombus. Other: Subcutaneous edema. IMPRESSION: No evidence of deep venous thrombosis in the lower extremities. Limited evaluation of the calf veins. Electronically Signed   By: Markus Daft M.D.   On: 04/26/2017 16:19   Dg Chest Portable 1 View  Result Date: 04/26/2017 CLINICAL DATA:  Former smoker, now with fever, possible UTI, AMS EXAM: PORTABLE CHEST 1 VIEW COMPARISON:  Chest x-ray dated 11/07/2012. FINDINGS: Study is hypoinspiratory. Dense opacity at the LEFT lung base, obscuring the LEFT hemidiaphragm and portion of the LEFT heart. Probable small LEFT pleural effusion. No pneumothorax seen. Aortic atherosclerosis. No acute or suspicious osseous finding. IMPRESSION: 1. New dense opacity at the LEFT lung base, highly suspicious for pneumonia given the history of fever, alternatively atelectasis. 2. Probable small LEFT pleural effusion. 3. Probable cardiomegaly, difficult to  characterize due to the obscuring opacity at the LEFT lung base. 4. Aortic atherosclerosis. Electronically Signed   By: Franki Cabot M.D.   On: 04/26/2017 11:41    Assessment:  The patient is a 84 y.o. woman admitted with altered mental status, fever, and abdominal discomfort.  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  She is on meropenem.    Abdomen and pelvic CT on 04/26/2017 suggested possible ovarian cancer with solid and cystic masses  in the pelvis, metastatic implants along the surface of the liver, in the omentum, and along the peritoneal surfaces.  There were multiple small nodules in the liver.  There were epicardial nodes and suspicious nodes in the retroperitoneum.  L3 and L4 were heterogeneous.  There were small bilateral pleural effusions.  She has iron deficiency anemia.  Diet appears good. He is on supplemental B12.  Colonoscopy in 10/2015 revealed diverticulosis.  EGD in 102017 was normal.  Patient's daughter notes no history of melena, hematochezia, or hematuria.  She has dementia.  Baseline performance status is 3-4.  Symptomatically, patient has had abdominal symptoms for an unclear period of time.   Plan:   1.  Oncology:  Discussed with patient's daughter results from recent abdominal/pelvic CT revealing pelvic masses, omental and peritoneal disease with ascites.  Left cystic adnexa mass 9.8 x 5.5 x 8.0 and 7.7 cm solid mass along the anterior aspect of the cystic mass.  There was a 6.6 cm solid mass in the area of the right ovary.  Concern for small liver metastasis.  Working diagnosis is ovarian cancer.    Discuss consideration of omental biopsy for confirmation.  Patient's daughter considering.  Given patient's performance status, she is not a candidate for chemotherapy.  Check CA125.  She is a candidate for Hospice.  Agree with palliative care consult.   Code status is DNR.  2.  Hematology:  Etiology of iron deficiency anemia is unclear.  Diet  appears good.  No history of bleeding.  Patient transfused  with PRBCs.  GI consulted with plan for endoscopy.   Thank you for allowing me to participate in Christine [name] 's care.  I will follow her closely with you while hospitalized and after discharge in the outpatient department.   Lequita Asal, MD  04/27/2017, 8:12 PM

## 2017-04-27 NOTE — Progress Notes (Signed)
Hemoglobin of 8.1, no signs of active GI bleeding.  Patient is pending workup with infectious disease and oncology at this time.  We will continue to follow.  No indication for urgent endoscopy at this time. Will await further workup and medical optimization prior to any endoscopy if needed.  We will repeat hepatic function panel today, his liver enzymes are elevated yesterday, but are likely due to hepatic lesions seen on CT.

## 2017-04-27 NOTE — Care Management (Signed)
Patient admitted from home with PNA and anemia.  Patient lives at home with Daughter.  Daughter works 3rd shift.  PCP Edwina Barth. Patient has previously been open with [facility], and has  RW in the home.  Patient was able to participate very minimally with PT.  PT is recommending home health, as they do not feel that she is rehabable.  Daughter has concerns about getting patient to and from hospital.  RNCM to consider WC, vs transport chair.  Daughter requesting hospital bed. PCS services list provided to daughter as requested. Daughter states that it will not be feasible for her to pay out of pocket for services or facility. Daughter states that they have previously applied for Medicaid, however "she would be penalized since she has a house in her name".  RNCM explained to daughter that she would have to have Medicaid for LTC or PCS services to be covered.  Daughter agreeable to home health services again with [facility] if indicated at discharge.  Palliative Care consult pending.  Pending if patient is appropriate for hospice at home or not. RNCM following

## 2017-04-27 NOTE — Progress Notes (Signed)
PHARMACY - PHYSICIAN COMMUNICATION CRITICAL VALUE ALERT - BLOOD CULTURE IDENTIFICATION (BCID)  Christine [name] is an 84 y.o. female who presented to [facility] on 04/26/2017 with a chief complaint of AMS  Assessment:  Tachycardic, hypotensive, PC 14, WBC 14 , CXR PNA   Name of physician (or Provider) Contacted: Arta Silence  Current antibiotics: Azithro/ceftriaxone  Changes to prescribed antibiotics recommended:  Recommendations accepted by provider -- Will d/c current abx and switch to meropenem 1g IV q8h for gram-negative bacteremia. Considering patient has an elevated Pro-cal and WBC will cover broadly w/ meropenem until BCID can specify which organism.  Results for orders placed or performed during the hospital encounter of 04/26/17  Blood Culture ID Panel (Reflexed) (Collected: 04/26/2017 11:29 AM)  Result Value Ref Range   Enterococcus species NOT DETECTED NOT DETECTED   Listeria monocytogenes NOT DETECTED NOT DETECTED   Staphylococcus species NOT DETECTED NOT DETECTED   Staphylococcus aureus NOT DETECTED NOT DETECTED   Streptococcus species NOT DETECTED NOT DETECTED   Streptococcus agalactiae NOT DETECTED NOT DETECTED   Streptococcus pneumoniae NOT DETECTED NOT DETECTED   Streptococcus pyogenes NOT DETECTED NOT DETECTED   Acinetobacter baumannii NOT DETECTED NOT DETECTED   Enterobacteriaceae species DETECTED (A) NOT DETECTED   Enterobacter cloacae complex NOT DETECTED NOT DETECTED   Escherichia coli NOT DETECTED NOT DETECTED   Klebsiella oxytoca NOT DETECTED NOT DETECTED   Klebsiella pneumoniae NOT DETECTED NOT DETECTED   Proteus species NOT DETECTED NOT DETECTED   Serratia marcescens NOT DETECTED NOT DETECTED   Carbapenem resistance NOT DETECTED NOT DETECTED   Haemophilus influenzae NOT DETECTED NOT DETECTED   Neisseria meningitidis NOT DETECTED NOT DETECTED   Pseudomonas aeruginosa NOT DETECTED NOT DETECTED   Candida albicans NOT DETECTED NOT DETECTED   Candida  glabrata NOT DETECTED NOT DETECTED   Candida krusei NOT DETECTED NOT DETECTED   Candida parapsilosis NOT DETECTED NOT DETECTED   Candida tropicalis NOT DETECTED NOT DETECTED   Tobie Lords, PharmD, BCPS Clinical Pharmacist 04/27/2017

## 2017-04-27 NOTE — Clinical Social Work Note (Signed)
CSW consulted for STR. Physician  Informed CSW this morning that patient will be returning home as STR is not appropriate at this time. Patient's daughter is wanting patient to either return home with home health or potentially hospice services if she is appropriate. Shela Leff MSW,LCSW [phone]

## 2017-04-27 NOTE — Consult Note (Signed)
[facility] Infectious Disease     Reason for Consult Sepsis, PNA   Referring Physician: Estanislado Spire Date of Admission:  04/26/2017   Active Problems:   Pneumonia   HPI: Christine [name] is a 84 y.o. female admitted with weakness, nv and fevers to 102. She has hx dementia and apparently had been complaining of abd pain. On admit wbc 14, temp up to 101, UA only 0-5 wbc, UCX  Pending, BCX + GNR. LFTs elevated. CT shows diffuse metastatic cancer of unknown primary - possibly ovarian.  She is currently afebrile, sitting in bed, unable to provide history but daughter at bedside   Past Medical History:  Diagnosis Date  . Aortic insufficiency   . Carpal tunnel syndrome, bilateral   . Cellulitis of left index finger   . Dementia   . GERD (gastroesophageal reflux disease)   . H. pylori infection   . HLD (hyperlipidemia)   . Hypertension   . Hyperthyroidism   . Mild cognitive impairment   . Osteoarthritis   . Peripheral edema   . Vitamin B 12 deficiency    Past Surgical History:  Procedure Laterality Date  . CARPAL TUNNEL RELEASE Bilateral   . TOTAL KNEE ARTHROPLASTY Bilateral    Social History   Tobacco Use  . Smoking status: Former Research scientist (life sciences)  . Smokeless tobacco: Never Used  Substance Use Topics  . Alcohol use: No    Alcohol/week: 0.0 oz  . Drug use: No   Family History  Problem Relation Age of Onset  . Stroke Mother   . Alzheimer's disease Father   . Diabetes Sister     Allergies:  Allergies  Allergen Reactions  . Sulfa Antibiotics Other (See Comments)    Reaction: unknown Patient states no allergies, but family states allergy  . Penicillins Rash and Other (See Comments)    Has patient had a PCN reaction causing immediate rash, facial/tongue/throat swelling, SOB or lightheadedness with hypotension: Unknown Has patient had a PCN reaction causing severe rash involving mucus membranes or skin necrosis: Unknown Has patient had a PCN reaction that required  hospitalization: Unknown Has patient had a PCN reaction occurring within the last 10 years: Unknown If all of the above answers are "NO", then may proceed with Cephalosporin use.  PT says no allergy - family says rash     Current antibiotics: Antibiotics Given (last 72 hours)    Date/Time Action Medication Dose Rate   04/26/17 1235 New Bag/Given   cefTRIAXone (ROCEPHIN) 1 g in sodium chloride 0.9 % 100 mL IVPB 1 g 200 mL/hr   04/26/17 1322 New Bag/Given   azithromycin (ZITHROMAX) 500 mg in sodium chloride 0.9 % 250 mL IVPB 500 mg 250 mL/hr   04/27/17 9678 New Bag/Given   meropenem (MERREM) 1 g in sodium chloride 0.9 % 100 mL IVPB 1 g 200 mL/hr      MEDICATIONS: . enoxaparin (LOVENOX) injection  40 mg Subcutaneous Q24H  . furosemide  20 mg Oral Daily  . pantoprazole (PROTONIX) IV  40 mg Intravenous Q12H  . QUEtiapine  25 mg Oral QPM    Review of Systems - 11 systems reviewed and negative per HPI Unable to obtain  OBJECTIVE: Temp:  [98.9 F (37.2 C)-101 F (38.3 C)] 99.7 F (37.6 C) (04/15 1220) Pulse Rate:  [69-88] 69 (04/15 0632) Resp:  [15-22] 17 (04/15 0632) BP: (91-150)/(41-78) 115/66 (04/15 1220) SpO2:  [94 %-100 %] 99 % (04/15 9381) Physical Exam  Constitutional:  Frail, sitting up in  bed, able to interact but unable to provide history due to dementia HENT: Sikes/AT, PERRLA, no scleral icterus Mouth/Throat: Oropharynx is clear and moist. No oropharyngeal exudate.  Cardiovascular: Normal rate, regular rhythm and normal heart sounds.  Pulmonary/Chest: Effort normal and breath sounds normal. No respiratory distress.  has no wheezes.  Neck = supple, no nuchal rigidity Abdominal: Soft. Mod distention, Mod distention, mild ttp diffuse  Lymphadenopathy: no cervical adenopathy. No axillary adenopathy Neurological: cannot tell me where she is or why  Skin: Skin is warm and dry. No rash noted. No erythema.  Psychiatric: a normal mood and affect.  behavior is normal.     LABS: Results for orders placed or performed during the hospital encounter of 04/26/17 (from the past 48 hour(s))  Blood culture (routine x 2)     Status: None (Preliminary result)   Collection Time: 04/26/17 11:29 AM  Result Value Ref Range   Specimen Description      BLOOD LEFT FOREARM Performed at [facility], [address], [place], [place] [address]    Special Requests      BOTTLES DRAWN AEROBIC AND ANAEROBIC Blood Culture adequate volume Performed at [facility] [address], [place], [place] [address]    Culture  Setup Time      GRAM NEGATIVE RODS AEROBIC BOTTLE ONLY CRITICAL RESULT CALLED TO, READ BACK BY AND VERIFIED WITH: Melat Wrisley BESANTI AT 9562 04/27/17.PMH Performed at [facility], [place] [address] [place], [place] [address]    Culture GRAM NEGATIVE RODS    Report Status PENDING   Blood Culture ID Panel (Reflexed)     Status: Abnormal   Collection Time: 04/26/17 11:29 AM  Result Value Ref Range   Enterococcus species NOT DETECTED NOT DETECTED   Listeria monocytogenes NOT DETECTED NOT DETECTED   Staphylococcus species NOT DETECTED NOT DETECTED   Staphylococcus aureus NOT DETECTED NOT DETECTED   Streptococcus species NOT DETECTED NOT DETECTED   Streptococcus agalactiae NOT DETECTED NOT DETECTED   Streptococcus pneumoniae NOT DETECTED NOT DETECTED   Streptococcus pyogenes NOT DETECTED NOT DETECTED   Acinetobacter baumannii NOT DETECTED NOT DETECTED   Enterobacteriaceae species DETECTED (A) NOT DETECTED    Comment: Enterobacteriaceae represent a large family of gram negative bacteria, not a single organism. Refer to culture for further identification. CRITICAL RESULT CALLED TO, READ BACK BY AND VERIFIED WITH: Fatou Dunnigan BESANTI AT 5784 04/27/17.PMH    Enterobacter cloacae complex NOT DETECTED NOT DETECTED   Escherichia coli NOT DETECTED NOT DETECTED   Klebsiella oxytoca NOT DETECTED NOT DETECTED   Klebsiella pneumoniae NOT DETECTED NOT  DETECTED   Proteus species NOT DETECTED NOT DETECTED   Serratia marcescens NOT DETECTED NOT DETECTED   Carbapenem resistance NOT DETECTED NOT DETECTED   Haemophilus influenzae NOT DETECTED NOT DETECTED   Neisseria meningitidis NOT DETECTED NOT DETECTED   Pseudomonas aeruginosa NOT DETECTED NOT DETECTED   Candida albicans NOT DETECTED NOT DETECTED   Candida glabrata NOT DETECTED NOT DETECTED   Candida krusei NOT DETECTED NOT DETECTED   Candida parapsilosis NOT DETECTED NOT DETECTED   Candida tropicalis NOT DETECTED NOT DETECTED    Comment: Performed at [facility], [address] [place], [place] [address]  CBC     Status: Abnormal   Collection Time: 04/26/17 11:37 AM  Result Value Ref Range   WBC 14.4 (H) 3.6 - 11.0 K/uL   RBC 2.64 (L) 3.80 - 5.20 MIL/uL   Hemoglobin 6.7 (L) 12.0 - 16.0 g/dL  HCT 20.9 (L) 35.0 - 47.0 %   MCV 79.3 (L) 80.0 - 100.0 fL   MCH 25.3 (L) 26.0 - 34.0 pg   MCHC 31.9 (L) 32.0 - 36.0 g/dL   RDW 15.3 (H) 11.5 - 14.5 %   Platelets 323 150 - 440 K/uL    Comment: Performed at [facility], [address] [place], [place] [address]  Comprehensive metabolic panel     Status: Abnormal   Collection Time: 04/26/17 11:37 AM  Result Value Ref Range   Sodium 132 (L) 135 - 145 mmol/L   Potassium 3.7 3.5 - 5.1 mmol/L   Chloride 98 (L) 101 - 111 mmol/L   CO2 26 22 - 32 mmol/L   Glucose, Bld 116 (H) 65 - 99 mg/dL   BUN 15 6 - 20 mg/dL   Creatinine, Ser 0.93 0.44 - 1.00 mg/dL   Calcium 8.3 (L) 8.9 - 10.3 mg/dL   Total Protein 7.0 6.5 - 8.1 g/dL   Albumin 3.3 (L) 3.5 - 5.0 g/dL   AST 277 (H) 15 - 41 U/L   ALT 112 (H) 14 - 54 U/L   Alkaline Phosphatase 257 (H) 38 - 126 U/L   Total Bilirubin 1.2 0.3 - 1.2 mg/dL   GFR calc non Af Amer 56 (L) >60 mL/min   GFR calc Af Amer >60 >60 mL/min    Comment: (NOTE) The eGFR has been calculated using the CKD EPI equation. This calculation has not been validated in all clinical situations. eGFR's  persistently <60 mL/min signify possible Chronic Kidney Disease.    Anion gap 8 5 - 15    Comment: Performed at [facility], [address] [place], [place] [address]  Urinalysis, Complete w Microscopic     Status: Abnormal   Collection Time: 04/26/17 11:37 AM  Result Value Ref Range   Color, Urine YELLOW (A) YELLOW   APPearance CLEAR (A) CLEAR   Specific Gravity, Urine 1.012 1.005 - 1.030   pH 7.0 5.0 - 8.0   Glucose, UA NEGATIVE NEGATIVE mg/dL   Hgb urine dipstick NEGATIVE NEGATIVE   Bilirubin Urine NEGATIVE NEGATIVE   Ketones, ur NEGATIVE NEGATIVE mg/dL   Protein, ur NEGATIVE NEGATIVE mg/dL   Nitrite NEGATIVE NEGATIVE   Leukocytes, UA NEGATIVE NEGATIVE   RBC / HPF 0-5 0 - 5 RBC/hpf   WBC, UA 0-5 0 - 5 WBC/hpf   Bacteria, UA NONE SEEN NONE SEEN   Squamous Epithelial / LPF 0-5 (A) NONE SEEN    Comment: Performed at [facility], [address] [place], [place] [address]  Strep pneumoniae urinary antigen     Status: None   Collection Time: 04/26/17 11:37 AM  Result Value Ref Range   Strep Pneumo Urinary Antigen NEGATIVE NEGATIVE    Comment:        Infection due to S. pneumoniae cannot be absolutely ruled out since the antigen present may be below the detection limit of the test. Performed at [facility] [address] [place], [place] [address]   ABO/Rh     Status: None   Collection Time: 04/26/17 11:37 AM  Result Value Ref Range   ABO/RH(D)      A NEG Performed at [facility], [address] [place], [place] [address]   Blood culture (routine x 2)     Status: None (Preliminary result)   Collection Time: 04/26/17 11:45 AM  Result Value Ref Range   Specimen Description  BLOOD RIGHT ARM Performed at [facility], [address] [place], [place] [address]    Special Requests      BOTTLES DRAWN AEROBIC AND ANAEROBIC Blood Culture results may not be optimal due to an excessive volume of blood received in culture  bottles Performed at [facility] [address] [place], [place] [address]    Culture  Setup Time      GRAM NEGATIVE RODS AEROBIC BOTTLE ONLY CRITICAL RESULT CALLED TO, READ BACK BY AND VERIFIED WITH: Lynesha Bango BESANTI AT 7616 04/27/17.PMH Performed at [facility], [address] [place], [place] [address]    Culture GRAM NEGATIVE RODS    Report Status PENDING   Prepare RBC     Status: None   Collection Time: 04/26/17  6:38 PM  Result Value Ref Range   Order Confirmation      ORDER PROCESSED BY BLOOD BANK Performed at [facility], [address] [place], [place] [address]   Type and screen [place]     Status: None (Preliminary result)   Collection Time: 04/26/17  6:38 PM  Result Value Ref Range   ABO/RH(D) A NEG    Antibody Screen NEG    Sample Expiration      04/29/2017 Performed at [facility], [address] [place], [place] [address]    Unit Number [id number]    Blood Component Type RBC LR PHER1    Unit division 00    Status of Unit ISSUED,FINAL    Transfusion Status OK TO TRANSFUSE    Crossmatch Result Compatible    Unit Number [id number]    Blood Component Type RED CELLS,LR    Unit division 00    Status of Unit ISSUED    Transfusion Status OK TO TRANSFUSE    Crossmatch Result Compatible   Procalcitonin - Baseline     Status: None   Collection Time: 04/26/17  6:38 PM  Result Value Ref Range   Procalcitonin 14.68 ng/mL    Comment:        Interpretation: PCT >= 10 ng/mL: Important systemic inflammatory response, almost exclusively due to severe bacterial sepsis or septic shock. (NOTE)       Sepsis PCT Algorithm           Lower Respiratory Tract                                      Infection PCT Algorithm    ----------------------------     ----------------------------         PCT < 0.25 ng/mL                PCT < 0.10 ng/mL         Strongly encourage             Strongly discourage    discontinuation of antibiotics    initiation of antibiotics    ----------------------------     -----------------------------       PCT 0.25 - 0.50 ng/mL            PCT 0.10 - 0.25 ng/mL               OR       >80% decrease in PCT            Discourage initiation of  antibiotics      Encourage discontinuation           of antibiotics    ----------------------------     -----------------------------         PCT >= 0.50 ng/mL              PCT 0.26 - 0.50 ng/mL                AND       <80% decrease in PCT             Encourage initiation of                                             antibiotics       Encourage continuation           of antibiotics    ----------------------------     -----------------------------        PCT >= 0.50 ng/mL                  PCT > 0.50 ng/mL               AND         increase in PCT                  Strongly encourage                                      initiation of antibiotics    Strongly encourage escalation           of antibiotics                                     -----------------------------                                           PCT <= 0.25 ng/mL                                                 OR                                        > 80% decrease in PCT                                     Discontinue / Do not initiate                                             antibiotics Performed at [facility], [address] [place], [place] [address]   Iron and TIBC     Status: Abnormal   Collection Time: 04/26/17  6:38 PM  Result Value Ref Range   Iron 8 (L) 28 - 170 ug/dL   TIBC 390 250 - 450 ug/dL   Saturation Ratios 2 (L) 10.4 - 31.8 %   UIBC 382 ug/dL    Comment: Performed at [facility], [address] [place], [place] [address]  Ferritin     Status: None   Collection Time: 04/26/17  6:38 PM  Result Value Ref Range   Ferritin 24 11 - 307 ng/mL    Comment: Performed at  [facility], [address] [place], [place] [address]  Basic metabolic panel     Status: Abnormal   Collection Time: 04/27/17  8:07 AM  Result Value Ref Range   Sodium 133 (L) 135 - 145 mmol/L   Potassium 3.7 3.5 - 5.1 mmol/L   Chloride 101 101 - 111 mmol/L   CO2 25 22 - 32 mmol/L   Glucose, Bld 84 65 - 99 mg/dL   BUN 15 6 - 20 mg/dL   Creatinine, Ser 1.08 (H) 0.44 - 1.00 mg/dL   Calcium 7.5 (L) 8.9 - 10.3 mg/dL   GFR calc non Af Amer 46 (L) >60 mL/min   GFR calc Af Amer 54 (L) >60 mL/min    Comment: (NOTE) The eGFR has been calculated using the CKD EPI equation. This calculation has not been validated in all clinical situations. eGFR's persistently <60 mL/min signify possible Chronic Kidney Disease.    Anion gap 7 5 - 15    Comment: Performed at [facility], [address] [place], [place] [address]  CBC     Status: Abnormal   Collection Time: 04/27/17  8:07 AM  Result Value Ref Range   WBC 8.4 3.6 - 11.0 K/uL   RBC 2.85 (L) 3.80 - 5.20 MIL/uL   Hemoglobin 8.1 (L) 12.0 - 16.0 g/dL   HCT 22.9 (L) 35.0 - 47.0 %   MCV 80.3 80.0 - 100.0 fL   MCH 28.3 26.0 - 34.0 pg   MCHC 35.2 32.0 - 36.0 g/dL   RDW 15.8 (H) 11.5 - 14.5 %   Platelets 262 150 - 440 K/uL    Comment: Performed at [facility], [address] [place], [place] [address]   No components found for: ESR, C REACTIVE PROTEIN MICRO: Recent Results (from the past 720 hour(s))  Blood culture (routine x 2)     Status: None (Preliminary result)   Collection Time: 04/26/17 11:29 AM  Result Value Ref Range Status   Specimen Description   Final    BLOOD LEFT FOREARM Performed at [facility], [address], [place], [place] [address]    Special Requests   Final    BOTTLES DRAWN AEROBIC AND ANAEROBIC Blood Culture adequate volume Performed at [facility], [place] [address] [place], [place] [address]    Culture  Setup Time   Final    GRAM NEGATIVE RODS AEROBIC BOTTLE  ONLY CRITICAL RESULT CALLED TO, READ BACK BY AND VERIFIED WITH: Tawania Daponte BESANTI AT 6759 04/27/17.PMH Performed at [facility], [place] [address], [place],  [address]    Culture GRAM NEGATIVE RODS  Final   Report Status PENDING  Incomplete  Blood Culture ID Panel (Reflexed)     Status: Abnormal   Collection Time: 04/26/17 11:29 AM  Result Value Ref Range Status   Enterococcus species NOT DETECTED NOT DETECTED Final   Listeria monocytogenes NOT DETECTED NOT DETECTED Final   Staphylococcus species  NOT DETECTED NOT DETECTED Final   Staphylococcus aureus NOT DETECTED NOT DETECTED Final   Streptococcus species NOT DETECTED NOT DETECTED Final   Streptococcus agalactiae NOT DETECTED NOT DETECTED Final   Streptococcus pneumoniae NOT DETECTED NOT DETECTED Final   Streptococcus pyogenes NOT DETECTED NOT DETECTED Final   Acinetobacter baumannii NOT DETECTED NOT DETECTED Final   Enterobacteriaceae species DETECTED (A) NOT DETECTED Final    Comment: Enterobacteriaceae represent a large family of gram negative bacteria, not a single organism. Refer to culture for further identification. CRITICAL RESULT CALLED TO, READ BACK BY AND VERIFIED WITH: Colby Catanese BESANTI AT 3846 04/27/17.PMH    Enterobacter cloacae complex NOT DETECTED NOT DETECTED Final   Escherichia coli NOT DETECTED NOT DETECTED Final   Klebsiella oxytoca NOT DETECTED NOT DETECTED Final   Klebsiella pneumoniae NOT DETECTED NOT DETECTED Final   Proteus species NOT DETECTED NOT DETECTED Final   Serratia marcescens NOT DETECTED NOT DETECTED Final   Carbapenem resistance NOT DETECTED NOT DETECTED Final   Haemophilus influenzae NOT DETECTED NOT DETECTED Final   Neisseria meningitidis NOT DETECTED NOT DETECTED Final   Pseudomonas aeruginosa NOT DETECTED NOT DETECTED Final   Candida albicans NOT DETECTED NOT DETECTED Final   Candida glabrata NOT DETECTED NOT DETECTED Final   Candida krusei NOT DETECTED NOT DETECTED Final   Candida  parapsilosis NOT DETECTED NOT DETECTED Final   Candida tropicalis NOT DETECTED NOT DETECTED Final    Comment: Performed at [facility], [address] [place], [place] [address]  Blood culture (routine x 2)     Status: None (Preliminary result)   Collection Time: 04/26/17 11:45 AM  Result Value Ref Range Status   Specimen Description   Final    BLOOD RIGHT ARM Performed at [facility], [address], [place], [place] [address]    Special Requests   Final    BOTTLES DRAWN AEROBIC AND ANAEROBIC Blood Culture results may not be optimal due to an excessive volume of blood received in culture bottles Performed at [facility] [address], [place], [place] [address]    Culture  Setup Time   Final    GRAM NEGATIVE RODS AEROBIC BOTTLE ONLY CRITICAL RESULT CALLED TO, READ BACK BY AND VERIFIED WITH: Newell Frater BESANTI AT 3009 04/27/17.PMH Performed at [facility], [address] [place], [place] [address]    Culture GRAM NEGATIVE RODS  Final   Report Status PENDING  Incomplete    IMAGING: Ct Abdomen Pelvis W Contrast  Result Date: 04/26/2017 CLINICAL DATA:  Abdominal and pelvic pain.  Nausea and vomiting. EXAM: CT ABDOMEN AND PELVIS WITH CONTRAST TECHNIQUE: Multidetector CT imaging of the abdomen and pelvis was performed using the standard protocol following bolus administration of intravenous contrast. CONTRAST:  137m OMNIPAQUE IOHEXOL 300 MG/ML  SOLN COMPARISON:  None. FINDINGS: Lower chest: Prominent nodes in the epicardial region on series 2, image 7 are identified with a sample node measuring 2.6 by 1.3 cm. Cardiomegaly. Small bilateral pleural effusions with atelectasis. Hepatobiliary: Multiple small liver masses identified. While some may represent cysts, I suspect several small metastases in the right and left hepatic lobes. The gallbladder is mildly distended but otherwise normal. Portal vein is patent. Pancreas: Pancreas is normal. Spleen: Normal in  size without focal abnormality. Adrenals/Urinary Tract: Renal cysts are identified. The kidneys and adrenal glands are otherwise normal. No renal obstruction. The bladder is unremarkable but decompressed with a Foley catheter. Stomach/Bowel: The stomach and small bowel is normal. No  obstruction. The colon is normal. The appendix is not seen but there is no secondary evidence of appendicitis is identified. Vascular/Lymphatic: Atherosclerotic changes are seen in the nonaneurysmal aorta, iliac vessels, and femoral vessels. Abnormal epicardial nodes are identified as above. A few prominent para-aortic nodes are suspicious with a sample node on series 2, image 35 measuring 12 mm. Reproductive: Calcified fibroids are seen in the uterus which is deviated to the right. There are solid and cystic masses in the adnexa. The cystic component on the left measures 9.8 by 5.5 by 8.0 cm on coronal image 48 and axial image 58. Along the superior anterior aspect of this cystic lesion is a solid mass which may be separate or part of the cystic mass measuring up to 7.5 cm on series 5, image 31. There is a solid mass in the expected location of the right ovary which measures up to 4.6 cm on axial image 53. Other: Ascites is seen throughout the abdomen. Multiple metastatic implants are seen along the surface of the liver with a representative implant measuring up to 10 cm on coronal image 27. Omental/peritoneal disease is seen elsewhere including an implant anteriorly on series 2, image 48 measuring up to 4.9 cm. A fluid collection deep in the pelvis well seen on coronal image 64 demonstrates several peripheral nodular components, consistent with metastatic implants. Musculoskeletal: Suspected metastatic disease to L4 and L3. No other bony metastatic disease identified. IMPRESSION: 1. Metastatic adenocarcinoma with metastatic implants along the surface of the liver, in the omentum, and along the peritoneal surfaces. Multiple small  nodules in the liver are likely metastatic. Abnormal epicardial nodes are metastatic and there is suspicion for metastatic nodes in the retroperitoneum. Heterogeneity of L3 and L4 is suspicious for metastatic disease as well. Given the solid and cystic masses in the pelvis, I suspect the primary malignancy is most likely ovarian, probably on the left. A PET-CT may further evaluate for a more definitive site of primary malignancy if clinically indicated. 2. Small bilateral pleural effusions. 3. Atherosclerotic changes in the aorta and branching vessels. Electronically Signed   By: Dorise Bullion III M.D   On: 04/26/2017 16:38   US Venous Img Lower Bilateral  Result Date: 04/26/2017 CLINICAL DATA:  Leg swelling. EXAM: BILATERAL LOWER EXTREMITY VENOUS DOPPLER ULTRASOUND TECHNIQUE: Gray-scale sonography with graded compression, as well as color Doppler and duplex ultrasound were performed to evaluate the lower extremity deep venous systems from the level of the common femoral vein and including the common femoral, femoral, profunda femoral, popliteal and calf veins including the posterior tibial, peroneal and gastrocnemius veins when visible. The superficial great saphenous vein was also interrogated. Spectral Doppler was utilized to evaluate flow at rest and with distal augmentation maneuvers in the common femoral, femoral and popliteal veins. COMPARISON:  None. FINDINGS: RIGHT LOWER EXTREMITY Common Femoral Vein: No evidence of thrombus. Normal compressibility, respiratory phasicity and response to augmentation. Saphenofemoral Junction: No evidence of thrombus. Normal compressibility and flow on color Doppler imaging. Profunda Femoral Vein: No evidence of thrombus. Normal compressibility and flow on color Doppler imaging. Femoral Vein: No evidence of thrombus. Normal compressibility, respiratory phasicity and response to augmentation. Popliteal Vein: No evidence of thrombus. Normal compressibility, respiratory  phasicity and response to augmentation. Calf Veins: Limited evaluation. Visualized right deep calf veins are patent without thrombus. LEFT LOWER EXTREMITY Common Femoral Vein: No evidence of thrombus. Normal compressibility, respiratory phasicity and response to augmentation. Saphenofemoral Junction: No evidence of thrombus. Normal compressibility and flow on  color Doppler imaging. Profunda Femoral Vein: No evidence of thrombus. Normal compressibility and flow on color Doppler imaging. Femoral Vein: No evidence of thrombus. Normal compressibility, respiratory phasicity and response to augmentation. Popliteal Vein: No evidence of thrombus. Normal compressibility, respiratory phasicity and response to augmentation. Calf Veins: Limited evaluation. Visualized left deep calf veins are patent without thrombus. Other: Subcutaneous edema. IMPRESSION: No evidence of deep venous thrombosis in the lower extremities. Limited evaluation of the calf veins. Electronically Signed   By: Markus Daft M.D.   On: 04/26/2017 16:19   Dg Chest Portable 1 View  Result Date: 04/26/2017 CLINICAL DATA:  Former smoker, now with fever, possible UTI, AMS EXAM: PORTABLE CHEST 1 VIEW COMPARISON:  Chest x-ray dated 11/07/2012. FINDINGS: Study is hypoinspiratory. Dense opacity at the LEFT lung base, obscuring the LEFT hemidiaphragm and portion of the LEFT heart. Probable small LEFT pleural effusion. No pneumothorax seen. Aortic atherosclerosis. No acute or suspicious osseous finding. IMPRESSION: 1. New dense opacity at the LEFT lung base, highly suspicious for pneumonia given the history of fever, alternatively atelectasis. 2. Probable small LEFT pleural effusion. 3. Probable cardiomegaly, difficult to characterize due to the obscuring opacity at the LEFT lung base. 4. Aortic atherosclerosis. Electronically Signed   By: Franki Cabot M.D.   On: 04/26/2017 11:41    Assessment:   KAMAIYA ANTILLA is a 84 y.o. female admitted with AMS, fevers  and NV and found to have likely metastatic cancer in abd and GNR bacteremia. Clinically fevers improving and wbc down to 8. Main issue is her new presentation of metastatic cancer.   Recommendations Continue meropenem pending ID and sensis on organism. Will likely be able to treat with oral regimen. Further wu of malignancy deferred to oncology. Thank you very much for allowing me to participate in the care of this patient. Please call with questions.   Cheral Marker. Ola Spurr, MD

## 2017-04-28 LAB — BPAM RBC
Blood Product Expiration Date: 201905042359
Blood Product Expiration Date: 201905052359
ISSUE DATE / TIME: 201904150250
ISSUE DATE / TIME: 201904142109
UNIT TYPE AND RH: 600
Unit Type and Rh: 600

## 2017-04-28 LAB — CBC
HCT: 26.4 % — ABNORMAL LOW (ref 35.0–47.0)
Hemoglobin: 8.8 g/dL — ABNORMAL LOW (ref 12.0–16.0)
MCH: 27.2 pg (ref 26.0–34.0)
MCHC: 33.3 g/dL (ref 32.0–36.0)
MCV: 81.5 fL (ref 80.0–100.0)
PLATELETS: 282 10*3/uL (ref 150–440)
RBC: 3.24 MIL/uL — AB (ref 3.80–5.20)
RDW: 15.4 % — ABNORMAL HIGH (ref 11.5–14.5)
WBC: 8 10*3/uL (ref 3.6–11.0)

## 2017-04-28 LAB — PROTIME-INR
INR: 1.27
Prothrombin Time: 15.8 seconds — ABNORMAL HIGH (ref 11.4–15.2)

## 2017-04-28 LAB — BASIC METABOLIC PANEL
Anion gap: 9 (ref 5–15)
BUN: 16 mg/dL (ref 6–20)
CALCIUM: 7.9 mg/dL — AB (ref 8.9–10.3)
CO2: 24 mmol/L (ref 22–32)
CREATININE: 1.14 mg/dL — AB (ref 0.44–1.00)
Chloride: 100 mmol/L — ABNORMAL LOW (ref 101–111)
GFR calc non Af Amer: 44 mL/min — ABNORMAL LOW (ref >60)
GFR, EST AFRICAN AMERICAN: 50 mL/min — AB (ref >60)
Glucose, Bld: 66 mg/dL (ref 65–99)
Potassium: 3.2 mmol/L — ABNORMAL LOW (ref 3.5–5.1)
SODIUM: 133 mmol/L — AB (ref 135–145)

## 2017-04-28 LAB — TYPE AND SCREEN
ABO/RH(D): A NEG
ANTIBODY SCREEN: NEGATIVE
UNIT DIVISION: 0
UNIT DIVISION: 0

## 2017-04-28 LAB — FOLATE: Folate: 16.2 ng/mL (ref >5.9)

## 2017-04-28 LAB — VITAMIN B12: Vitamin B-12: 418 pg/mL (ref 180–914)

## 2017-04-28 MED ORDER — MORPHINE BOLUS VIA INFUSION: 5.0000 mg | INTRAVENOUS | Status: DC | PRN

## 2017-04-28 MED ORDER — LORAZEPAM BOLUS VIA INFUSION: 2.0000 mg | INTRAVENOUS | Status: DC | PRN

## 2017-04-28 MED ORDER — LORAZEPAM 0.5 MG PO TABS: 0.5000 mg | ORAL_TABLET | ORAL | 0 refills | Status: AC | PRN

## 2017-04-28 MED ORDER — LORAZEPAM 2 MG/ML IJ SOLN: 1.0000 mg | INTRAMUSCULAR | Status: DC | PRN

## 2017-04-28 MED ORDER — MORPHINE SULFATE (PF) 2 MG/ML IV SOLN: 2.0000 mg | INTRAVENOUS | Status: DC | PRN

## 2017-04-28 MED ORDER — MORPHINE SULFATE 20 MG/5ML PO SOLN: 5.0000 mg | ORAL | 0 refills | Status: AC | PRN

## 2017-04-28 NOTE — Discharge Instructions (Signed)
Hospice Introduction Hospice is a service that is designed to provide people who are terminally ill and their families with medical, spiritual, and psychological support. Its aim is to improve your quality of life by keeping you as alert and comfortable as possible. Who will be my providers when I begin hospice care? Hospice teams often include:  A nurse.  A doctor. The hospice doctor will be available for your care, but you can bring your regular doctor or nurse practitioner.  Social workers.  Religious leaders (such as a Clinical biochemist).  Trained volunteers.  What roles will providers play in my care? Hospice is performed by a team of health care professionals and volunteers who:  Help keep you comfortable: ? Hospice can be provided in your home or in a homelike setting. ? The hospice staff works with your family and friends to help meet your needs. ? You will enjoy the support of loved ones by receiving much of your basic care from family and friends.  Provide pain relief and manage your symptoms. The staff supply all necessary medicines and equipment.  Provide companionship when you are alone.  Allow you and your family to rest. They may do light housekeeping, prepare meals, and run errands.  Provide counseling. They will make sure your emotional, spiritual, and social needs and those of your family are being met.  Provide spiritual care: ? Spiritual care will be individualized to meet your needs and your family's needs. ? Spiritual care may involve:  Helping you look at what death means to you.  Helping you say goodbye to your family and friends.  Performing a specific religious ceremony or ritual.  When should hospice care begin? Most people who use hospice are believed to have fewer than 6 months to live.  Your family and health care providers can help you decide when hospice services should begin.  If your condition improves, you may discontinue the program.  What  should I consider before selecting a program? Most hospice programs are run by nonprofit, independent organizations. Some are affiliated with hospitals, nursing homes, or home health care agencies. Hospice programs can take place in the home or at a hospice center, hospital, or skilled nursing facility. When choosing a hospice program, ask the following questions:  What services are available to me?  What services will be offered to my loved ones?  How involved will my loved ones be?  How involved will my health care provider be?  Who makes up the hospice care team? How are they trained or screened?  How will my pain and symptoms be managed?  If my circumstances change, can the services be provided in a different setting, such as my home or in the hospital?  Is the program reviewed and licensed by the state or certified in some other way?  Where can I learn more about hospice? You can learn about existing hospice programs in your area from your health care providers. You can also read more about hospice online. The websites of the following organizations contain helpful information:  The [facility] and Palliative Care Organization [facility]).  The Hospice Association of America ([place]).  The [place].  The American Cancer Society (ACS).  Hospice Net.  This information is not intended to replace advice given to you by your health care provider. Make sure you discuss any questions you have with your health care provider. Document Released: 04/18/2003 Document Revised: 08/16/2015 Document Reviewed: 11/09/2012 Elsevier Interactive Patient Education  2017 [employer].

## 2017-04-28 NOTE — Progress Notes (Signed)
Family Meeting Note  Advance Directive:yes  Today a meeting took place with the Patient and Daughters at bedside.  Patient is unable to participate due JR:PZPSUG capacity Comatose   The following clinical team members were present during this meeting:MD  The following were discussed:Patient's diagnosis:   59 y f with below medical issues admitted for weakness, anasarca, severe anemia, possible mets in liver, and sepsis. Per [facility] patient likely has Ovarian CA - metastatic, not a candidate for chemo. Family agreeable to persue Hospice.   Aortic insufficiency    . Carpal tunnel syndrome, bilateral   . Cellulitis of left index finger   . Dementia   . GERD (gastroesophageal reflux disease)   . H. pylori infection   . HLD (hyperlipidemia)   . Hypertension   . Hyperthyroidism   . Mild cognitive impairment   . Osteoarthritis   . Peripheral edema   . Vitamin B 12 deficiency     Patient's progosis: < 2 weeks and Goals for treatment: DNR  Additional follow-up to be provided: Comfort care at this time. Family interested in pursuing hospice home if she qualifies.  Time spent during discussion:20 minutes  Max Sane, MD

## 2017-04-28 NOTE — Care Management (Signed)
Patient to discharge to [facility] Today.  CSW facilitating.  RNCM signing off

## 2017-04-28 NOTE — Clinical Social Work Note (Signed)
Patient to discharge to hospice home. CSW not consulted and Santiago Glad with hospice has completed referral and arranged discharge. Shela Leff MSW,LCSW [phone]

## 2017-04-28 NOTE — Progress Notes (Signed)
As per Dr. Manuella Ghazi, patient was made comfort care.  She is being sent home with home hospice.  As per Dr. Manuella Ghazi, no further endoscopy needed, as family does not want any procedures or interventions at this time.  GI service will sign off.  Please page with any questions.

## 2017-04-28 NOTE — Discharge Summary (Signed)
[place] [facility] [place] NAME: Christine [name]    MR#:  [id number]  DATE OF BIRTH:  [birth year]  DATE OF ADMISSION:  04/26/2017   ADMITTING PHYSICIAN: Demetrios Loll, MD  DATE OF DISCHARGE: 04/28/2017  PRIMARY CARE PHYSICIAN: Baxter Hire, MD   ADMISSION DIAGNOSIS:  Anemia, unspecified type [D64.9] Community acquired pneumonia of left lower lobe of lung ([place]) [J18.1] DISCHARGE DIAGNOSIS:  Active Problems:   Pneumonia  SECONDARY DIAGNOSIS:   Past Medical History:  Diagnosis Date  . Aortic insufficiency   . Carpal tunnel syndrome, bilateral   . Cellulitis of left index finger   . Dementia   . GERD (gastroesophageal reflux disease)   . H. pylori infection   . HLD (hyperlipidemia)   . Hypertension   . Hyperthyroidism   . Mild cognitive impairment   . Osteoarthritis   . Peripheral edema   . Vitamin B 12 deficiency    HOSPITAL COURSE:  84 y.o. f with below medical issues admitted for weakness, anasarca, severe anemia, possible mets in liver, and sepsis. Per [facility] patient likely has Ovarian CA - metastatic, not a candidate for chemo. Family agreeable to persue Hospice. She seem to be actively dying and appropriate for Hospice Home.  Carpal tunnel syndrome, bilateral    . Cellulitis of left index finger   . Dementia   . GERD (gastroesophageal reflux disease)   . H. pylori infection   . HLD (hyperlipidemia)   . Hypertension   . Hyperthyroidism   . Mild cognitive impairment   . Osteoarthritis   . Peripheral edema   . Vitamin B 12 deficiency     She is admitted with altered mental status, fever, and abdominal discomfort.  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   I had extensive discussion with both daughters at bedside and they're in agreement. DISCHARGE CONDITIONS:  critical CONSULTS OBTAINED:  Treatment Team:  Virgel Manifold, MD Lequita Asal, MD Leonel Ramsay,  MD DRUG ALLERGIES:   Allergies  Allergen Reactions  . Sulfa Antibiotics Other (See Comments)    Reaction: unknown Patient states no allergies, but family states allergy  . Penicillins Rash and Other (See Comments)    Has patient had a PCN reaction causing immediate rash, facial/tongue/throat swelling, SOB or lightheadedness with hypotension: Unknown Has patient had a PCN reaction causing severe rash involving mucus membranes or skin necrosis: Unknown Has patient had a PCN reaction that required hospitalization: Unknown Has patient had a PCN reaction occurring within the last 10 years: Unknown If all of the above answers are "NO", then may proceed with Cephalosporin use.  PT says no allergy - family says rash    DISCHARGE MEDICATIONS:   Allergies as of 04/28/2017      Reactions   Sulfa Antibiotics Other (See Comments)   Reaction: unknown Patient states no allergies, but family states allergy   Penicillins Rash, Other (See Comments)   Has patient had a PCN reaction causing immediate rash, facial/tongue/throat swelling, SOB or lightheadedness with hypotension: Unknown Has patient had a PCN reaction causing severe rash involving mucus membranes or skin necrosis: Unknown Has patient had a PCN reaction that required hospitalization: Unknown Has patient had a PCN reaction occurring within the last 10 years: Unknown If all of the above answers are "NO", then may proceed with Cephalosporin use. PT says no allergy - family says rash      Medication List    STOP taking these medications  furosemide 20 MG tablet Commonly known as:  LASIX   QUEtiapine 25 MG tablet Commonly known as:  SEROQUEL     TAKE these medications   LORazepam 0.5 MG tablet Commonly known as:  ATIVAN Take 1 tablet (0.5 mg total) by mouth every 4 (four) hours as needed for anxiety.   morphine 20 MG/5ML solution Take 1.3 mLs (5.2 mg total) by mouth every 2 (two) hours as needed for pain.      DISCHARGE  INSTRUCTIONS:   DIET:  Pleasure feeding DISCHARGE CONDITION:  Critical ACTIVITY:  Activity as tolerated OXYGEN:  Home Oxygen: Yes.    Oxygen Delivery: 2 liters/min via Patient connected to nasal cannula oxygen DISCHARGE LOCATION:  Hospice Home   If you experience worsening of your admission symptoms, develop shortness of breath, life threatening emergency, suicidal or homicidal thoughts you must seek medical attention immediately by calling 911 or calling your MD immediately  if symptoms less severe.  You Must read complete instructions/literature along with all the possible adverse reactions/side effects for all the Medicines you take and that have been prescribed to you. Take any new Medicines after you have completely understood and accpet all the possible adverse reactions/side effects.   Please note  You were cared for by a hospitalist during your hospital stay. If you have any questions about your discharge medications or the care you received while you were in the hospital after you are discharged, you can call the unit and asked to speak with the hospitalist on call if the hospitalist that took care of you is not available. Once you are discharged, your primary care physician will handle any further medical issues. Please note that NO REFILLS for any discharge medications will be authorized once you are discharged, as it is imperative that you return to your primary care physician (or establish a relationship with a primary care physician if you do not have one) for your aftercare needs so that they can reassess your need for medications and monitor your lab values.   On the day of Discharge:  VITAL SIGNS:  Blood pressure 105/84, pulse 70, temperature 98.7 F (37.1 C), temperature source Oral, resp. rate 20, height 5\' 6"  (1.676 m), weight 90.7 kg (200 lb), SpO2 100 %. PHYSICAL EXAMINATION:  GENERAL:  84 y.o.-year-old patient lying in the bed - actively dying EYES: Pupils equal,  round, reactive to light and accommodation. No scleral icterus. Extraocular muscles intact.  HEENT: Head atraumatic, normocephalic. Oropharynx and nasopharynx clear.  NECK:  Supple, no jugular venous distention. No thyroid enlargement, no tenderness.  LUNGS: Normal breath sounds bilaterally, no wheezing, rales,rhonchi or crepitation. No use of accessory muscles of respiration.  CARDIOVASCULAR: S1, S2 normal. No murmurs, rubs, or gallops.  ABDOMEN: Soft, non-tender, non-distended. Bowel sounds present. No organomegaly or mass.  EXTREMITIES: No pedal edema, cyanosis, or clubbing.  NEUROLOGIC: Cranial nerves II through XII are intact. Muscle strength 5/5 in all extremities. Sensation intact. Gait not checked.  PSYCHIATRIC: The patient is lethargic SKIN: No obvious rash, lesion, or ulcer.  DATA REVIEW:   CBC Recent Labs  Lab 04/28/17 0542  WBC 8.0  HGB 8.8*  HCT 26.4*  PLT 282    Chemistries  Recent Labs  Lab 04/27/17 1450 04/28/17 0542  NA  --  133*  K  --  3.2*  CL  --  100*  CO2  --  24  GLUCOSE  --  66  BUN  --  16  CREATININE  --  1.14*  CALCIUM  --  7.9*  AST 114*  --   ALT 66*  --   ALKPHOS 190*  --   BILITOT 1.9*  --     Follow-up Information    Baxter Hire, MD. Schedule an appointment as soon as possible for a visit in 1 week(s).   Specialty:  Internal Medicine Contact information: [place] [address] [phone]           Management plans discussed with the patient, family (daughters at bedside) and they are in agreement.  CODE STATUS: DNR   TOTAL TIME TAKING CARE OF THIS PATIENT: 45 minutes.    Max Sane M.D on 04/28/2017 at 1:25 PM  Between 7am to 6pm - Pager - [phone]  After 6pm go to [email or web address] - Proofreader  [facility]  Office  [phone]  CC: Primary care physician; Baxter Hire, MD   Note: This dictation was prepared with Dragon dictation along with  smaller phrase technology. Any transcriptional errors that result from this process are unintentional.

## 2017-04-28 NOTE — Progress Notes (Signed)
Chaplain responded to EOL consult and met with patient. Chaplain provided active listening, prayer, and read most of the Book of [place] to the patient. She also recited Psalm 100 twice. Chaplain checked in with the family and volunteered to return as needed.

## 2017-04-28 NOTE — Progress Notes (Signed)
New hospice home referral received from [facility]. Patient is an 84 year old woman with a  Known history of dementia, GERD, osteoarthritis HLD, and HTN admitted to [facility] from home on 4/14 for evaluation and treatment of fever and abdominal pain. In the ED she was found to have a hemoglobin of 6.7 and fever of 102. Chest xray showed left lobe PNA.  Abdominal CT revealed "metastatic adenocarcinoma with metastatic implants along the surface of the liver, in the omentum, and along the peritoneal surfaces". After discussion with attending physician Dr. Manuella Ghazi patient's daughter chose to focus on her mother's comfort with a transfer to the hospice home. Writer met in the family room with patient's daughter [name] and grandson Thurmond Butts to initiated education regarding hospice services, philosophy and team approach to care with good understanding voiced. Questions answered, consents signed. Patient information faxed to referral. Hospital care team updated. Report called to the hospice home, EMS notified for transport. Singed DNR in place in discharge packet. Flo Shanks RN, BSN, [facility] [place], hospital liaison [phone]

## 2017-04-29 LAB — CULTURE, BLOOD (ROUTINE X 2): Special Requests: ADEQUATE

## 2017-04-29 LAB — CA 125: Cancer Antigen (CA) 125: 383.4 U/mL — ABNORMAL HIGH (ref 0.0–38.1)

## 2017-06-13 DEATH — deceased

## 2019-11-05 IMAGING — US US EXTREM LOW VENOUS BILAT
1 series · 13 of 24 positions shown · non-contrast
Comparison: None.

CLINICAL DATA: Leg swelling.



[Series 1: us extrem low venous bilat · 0.10mm/px · 13 of 53 slices shown]
[im 1/53]
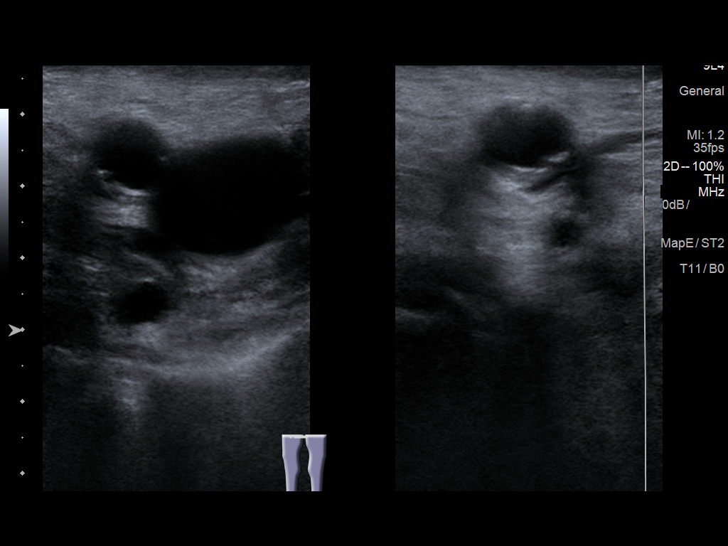
[im 5/53]
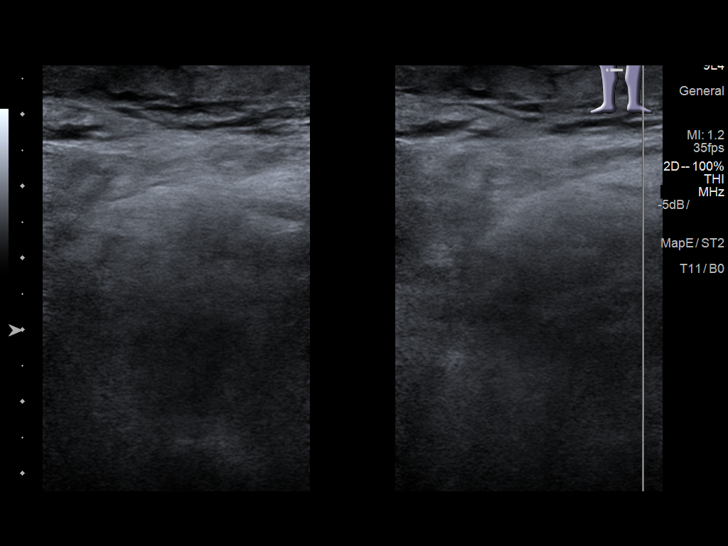
[im 10/53]
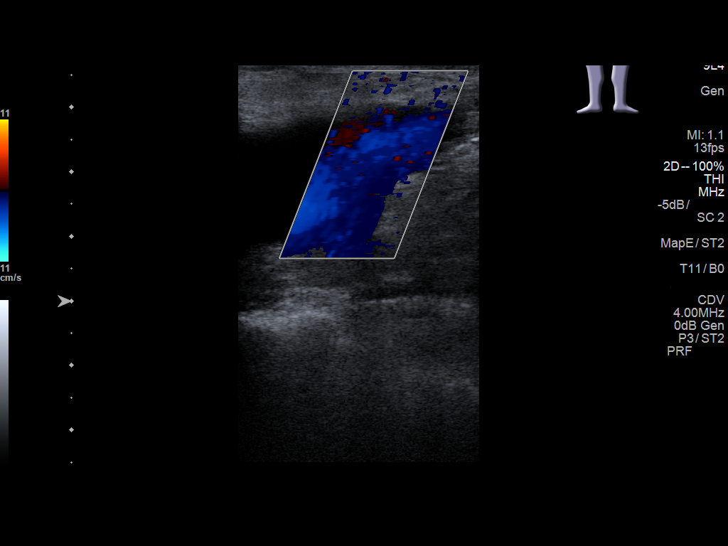
[im 14/53]
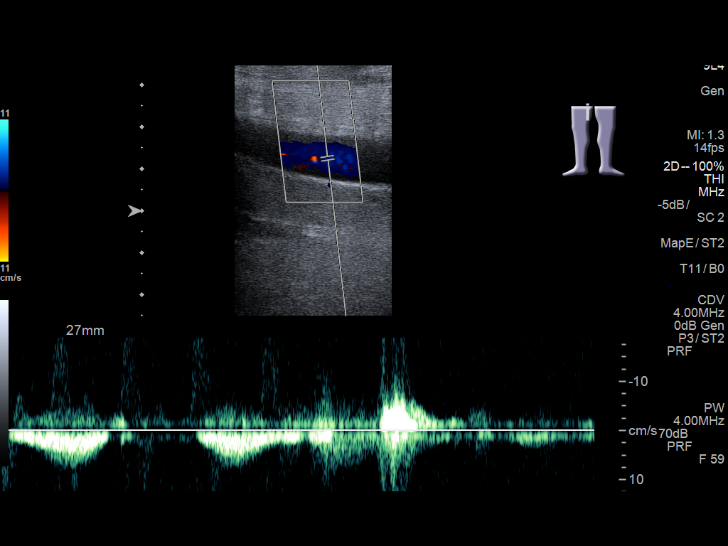
[im 19/53]
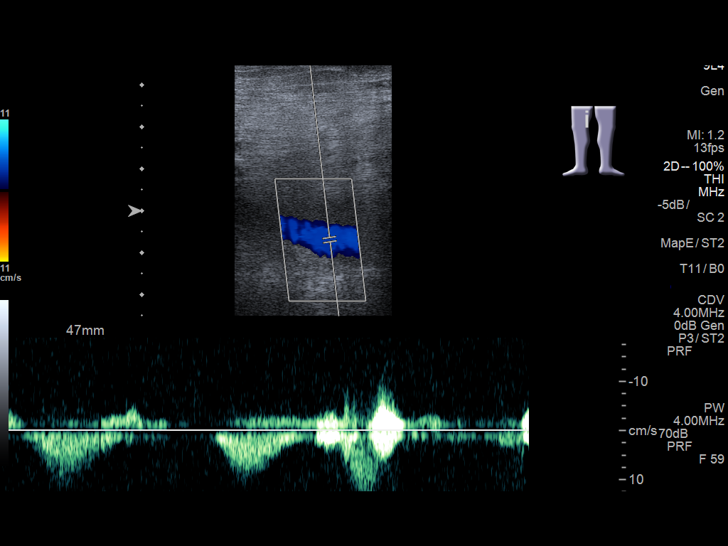
[im 23/53]
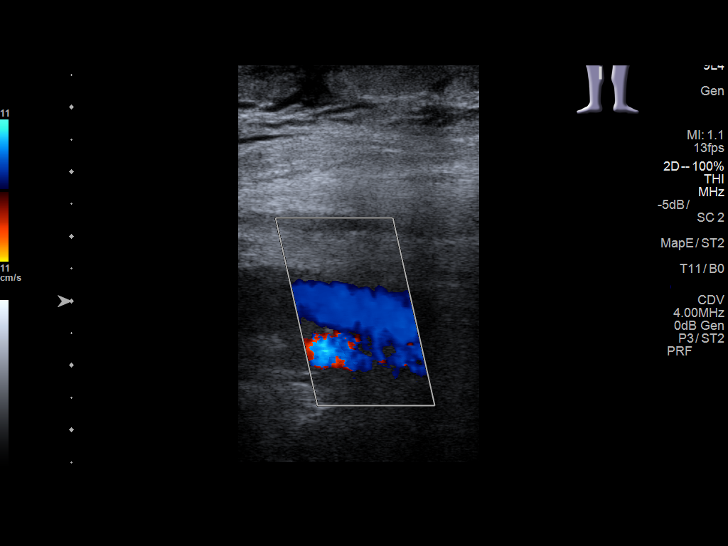
[im 28/53]
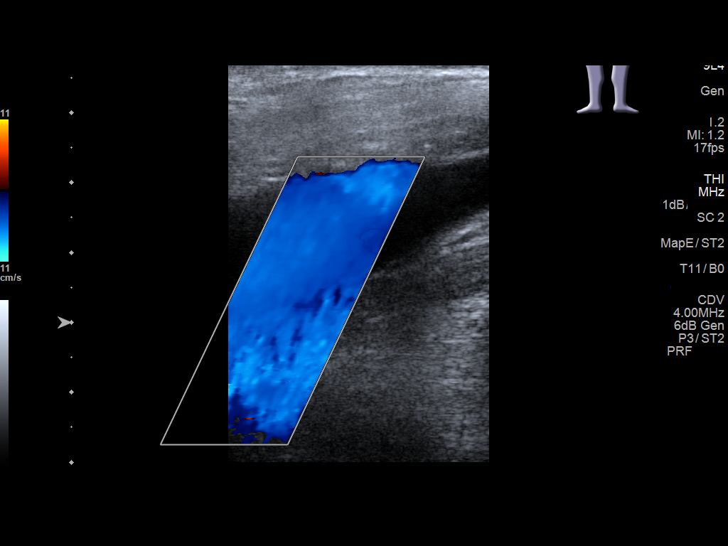
[im 30/53]
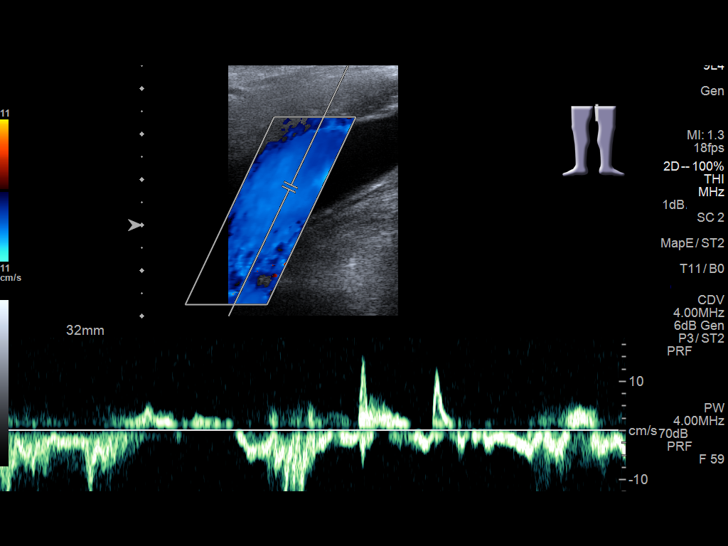
[im 34/53]
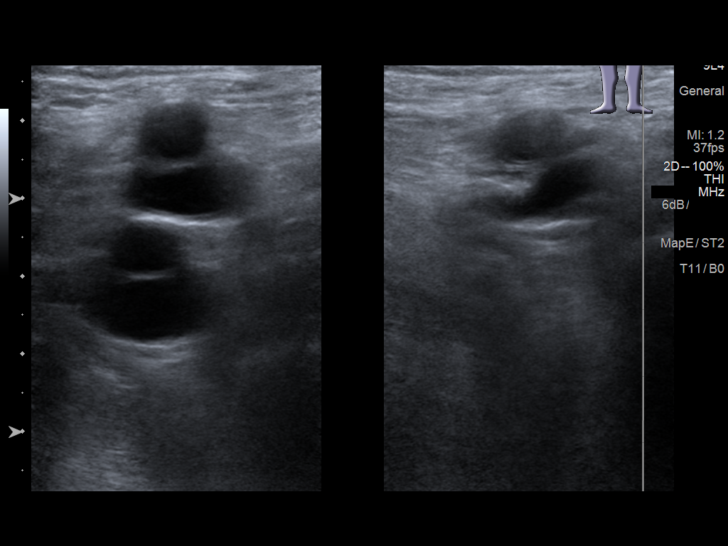
[im 39/53]
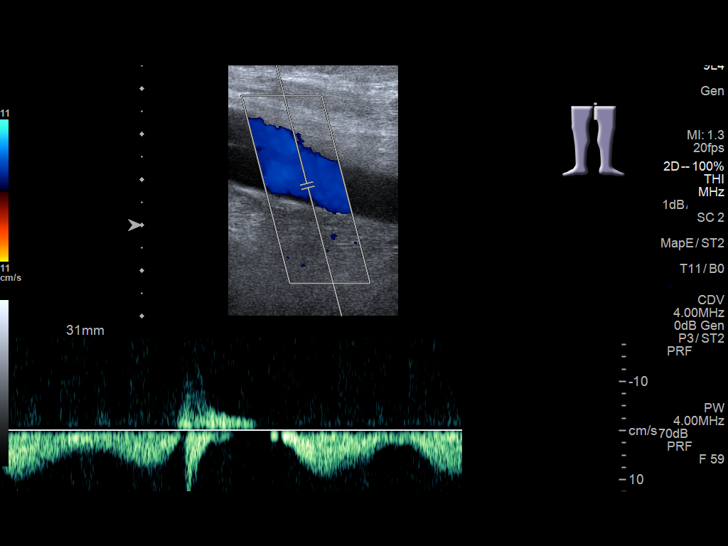
[im 43/53]
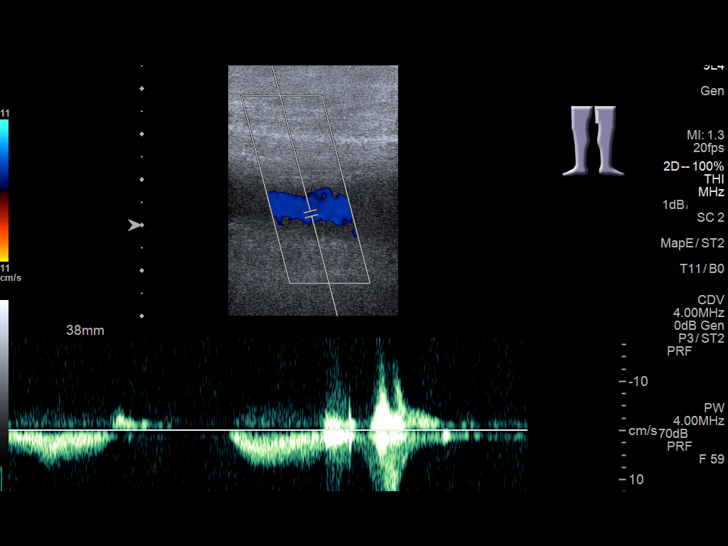
[im 48/53]
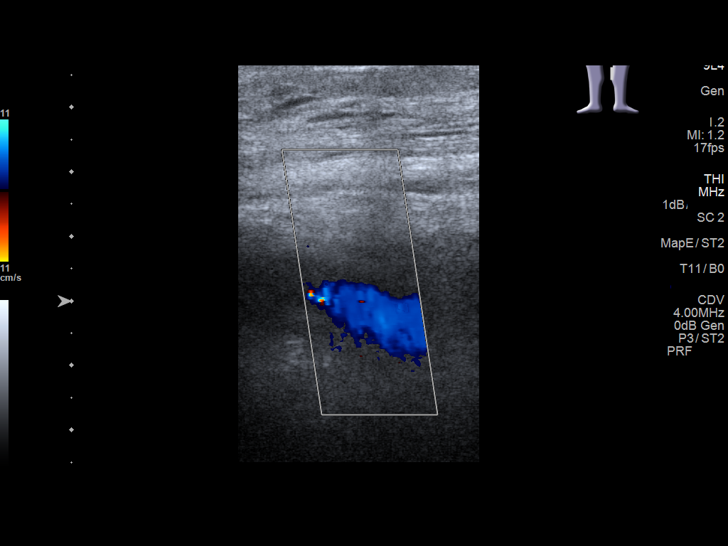
[im 53/53]
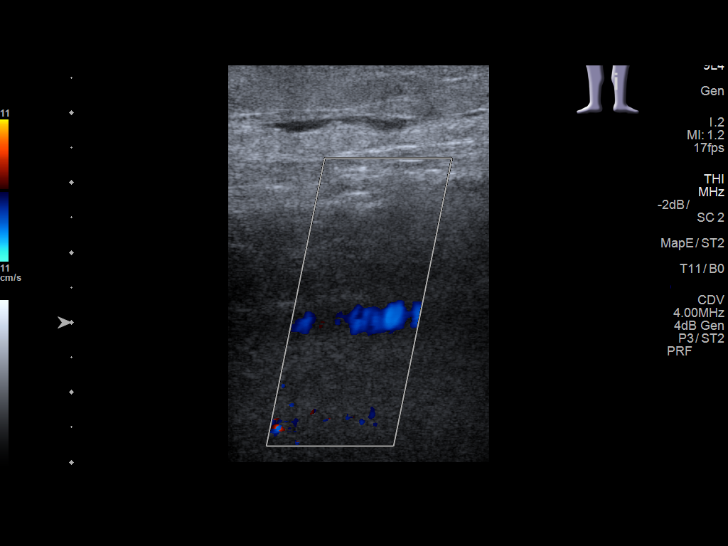

[13 of 24 positions shown; findings below may reference images not displayed]

FINDINGS: RIGHT LOWER EXTREMITY

Common Femoral Vein: No evidence of thrombus. Normal
compressibility, respiratory phasicity and response to augmentation.

Saphenofemoral Junction: No evidence of thrombus. Normal
compressibility and flow on color Doppler imaging.

Profunda Femoral Vein: No evidence of thrombus. Normal
compressibility and flow on color Doppler imaging.

Femoral Vein: No evidence of thrombus. Normal compressibility,
respiratory phasicity and response to augmentation.

Popliteal Vein: No evidence of thrombus. Normal compressibility,
respiratory phasicity and response to augmentation.

Calf Veins: Limited evaluation. Visualized right deep calf veins are
patent without thrombus.

LEFT LOWER EXTREMITY

Common Femoral Vein: No evidence of thrombus. Normal
compressibility, respiratory phasicity and response to augmentation.

Saphenofemoral Junction: No evidence of thrombus. Normal
compressibility and flow on color Doppler imaging.

Profunda Femoral Vein: No evidence of thrombus. Normal
compressibility and flow on color Doppler imaging.

Femoral Vein: No evidence of thrombus. Normal compressibility,
respiratory phasicity and response to augmentation.

Popliteal Vein: No evidence of thrombus. Normal compressibility,
respiratory phasicity and response to augmentation.

Calf Veins: Limited evaluation. Visualized left deep calf veins are
patent without thrombus.

Other: Subcutaneous edema.
IMPRESSION: No evidence of deep venous thrombosis in the lower extremities.
Limited evaluation of the calf veins.
